# Patient Record
Sex: Male | Born: 1990 | Race: Asian | Hispanic: No | Marital: Single | Smoking: Never smoker
Health system: Southern US, Community
[De-identification: ages and names within clinical notes are randomized; demographics above are authoritative.]

## PROBLEM LIST (undated history)

## (undated) DIAGNOSIS — S060XAA Concussion with loss of consciousness status unknown, initial encounter: Secondary | ICD-10-CM

## (undated) DIAGNOSIS — R5600 Simple febrile convulsions: Secondary | ICD-10-CM

## (undated) DIAGNOSIS — Z789 Other specified health status: Secondary | ICD-10-CM

## (undated) DIAGNOSIS — S060X9A Concussion with loss of consciousness of unspecified duration, initial encounter: Secondary | ICD-10-CM

## (undated) HISTORY — PX: FINGER SURGERY: SHX640

## (undated) HISTORY — PX: INNER EAR SURGERY: SHX679

---
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
## 2011-09-21 ENCOUNTER — Emergency Department (HOSPITAL_COMMUNITY): Payer: Federal, State, Local not specified - PPO

## 2011-09-21 ENCOUNTER — Encounter (HOSPITAL_COMMUNITY): Payer: Self-pay

## 2011-09-21 ENCOUNTER — Inpatient Hospital Stay (HOSPITAL_COMMUNITY)
Admission: EM | Admit: 2011-09-21 | Discharge: 2011-09-26 | DRG: 734 | Disposition: A | Discharge status: Home / Self Care | Payer: Federal, State, Local not specified - PPO | Source: Ambulatory Visit | Attending: General Surgery | Admitting: General Surgery

## 2011-09-21 DIAGNOSIS — Y998 Other external cause status: Secondary | ICD-10-CM

## 2011-09-21 DIAGNOSIS — S36119A Unspecified injury of liver, initial encounter: Secondary | ICD-10-CM

## 2011-09-21 DIAGNOSIS — N2889 Other specified disorders of kidney and ureter: Secondary | ICD-10-CM | POA: Diagnosis present

## 2011-09-21 DIAGNOSIS — S36114A Minor laceration of liver, initial encounter: Secondary | ICD-10-CM | POA: Diagnosis present

## 2011-09-21 DIAGNOSIS — T1490XA Injury, unspecified, initial encounter: Secondary | ICD-10-CM | POA: Diagnosis present

## 2011-09-21 DIAGNOSIS — S36112A Contusion of liver, initial encounter: Secondary | ICD-10-CM

## 2011-09-21 DIAGNOSIS — S37009A Unspecified injury of unspecified kidney, initial encounter: Secondary | ICD-10-CM | POA: Diagnosis present

## 2011-09-21 DIAGNOSIS — S37039A Laceration of unspecified kidney, unspecified degree, initial encounter: Secondary | ICD-10-CM

## 2011-09-21 DIAGNOSIS — D62 Acute posthemorrhagic anemia: Secondary | ICD-10-CM

## 2011-09-21 DIAGNOSIS — Y9367 Activity, basketball: Secondary | ICD-10-CM

## 2011-09-21 DIAGNOSIS — Y9239 Other specified sports and athletic area as the place of occurrence of the external cause: Secondary | ICD-10-CM

## 2011-09-21 DIAGNOSIS — Y92838 Other recreation area as the place of occurrence of the external cause: Secondary | ICD-10-CM

## 2011-09-21 DIAGNOSIS — S36113A Laceration of liver, unspecified degree, initial encounter: Principal | ICD-10-CM | POA: Diagnosis present

## 2011-09-21 DIAGNOSIS — S37099A Other injury of unspecified kidney, initial encounter: Secondary | ICD-10-CM

## 2011-09-21 HISTORY — DX: Concussion with loss of consciousness of unspecified duration, initial encounter: S06.0X9A

## 2011-09-21 HISTORY — DX: Other specified health status: Z78.9

## 2011-09-21 HISTORY — DX: Simple febrile convulsions: R56.00

## 2011-09-21 HISTORY — DX: Concussion with loss of consciousness status unknown, initial encounter: S06.0XAA

## 2011-09-21 LAB — DIFFERENTIAL
Basophils Absolute: 0 K/uL (ref 0.0–0.1)
Basophils Relative: 0 % (ref 0–1)
Eosinophils Absolute: 0 K/uL (ref 0.0–0.7)
Eosinophils Relative: 0 % (ref 0–5)
Lymphocytes Relative: 11 % — ABNORMAL LOW (ref 12–46)
Lymphs Abs: 2.1 K/uL (ref 0.7–4.0)
Monocytes Absolute: 1.1 K/uL — ABNORMAL HIGH (ref 0.1–1.0)
Monocytes Relative: 6 % (ref 3–12)
Neutro Abs: 15.9 K/uL — ABNORMAL HIGH (ref 1.7–7.7)
Neutrophils Relative %: 83 % — ABNORMAL HIGH (ref 43–77)

## 2011-09-21 LAB — TYPE AND SCREEN
ABO/RH(D): A POS
Antibody Screen: NEGATIVE

## 2011-09-21 LAB — URINALYSIS, ROUTINE W REFLEX MICROSCOPIC
Glucose, UA: 100 mg/dL — AB
Ketones, ur: >80 mg/dL — AB
Nitrite: POSITIVE — AB
Protein, ur: >300 mg/dL — AB
Specific Gravity, Urine: 1.036 — ABNORMAL HIGH (ref 1.005–1.030)
Urobilinogen, UA: 1 mg/dL (ref 0.0–1.0)
pH: 5 (ref 5.0–8.0)

## 2011-09-21 LAB — PROTIME-INR
INR: 1.1 (ref 0.00–1.49)
Prothrombin Time: 14.4 s (ref 11.6–15.2)

## 2011-09-21 LAB — URINE MICROSCOPIC-ADD ON

## 2011-09-21 LAB — COMPREHENSIVE METABOLIC PANEL
ALT: 26 U/L (ref 0–53)
AST: 71 U/L — ABNORMAL HIGH (ref 0–37)
Albumin: 4.9 g/dL (ref 3.5–5.2)
Alkaline Phosphatase: 75 U/L (ref 39–117)
CO2: 23 mEq/L (ref 19–32)
Chloride: 99 mEq/L (ref 96–112)
Creatinine, Ser: 1.45 mg/dL — ABNORMAL HIGH (ref 0.50–1.35)
GFR calc non Af Amer: 67 mL/min — ABNORMAL LOW (ref >90)
Potassium: 3.7 mEq/L (ref 3.5–5.1)
Total Bilirubin: 0.8 mg/dL (ref 0.3–1.2)

## 2011-09-21 LAB — CBC
HCT: 46.7 % (ref 39.0–52.0)
MCV: 66.8 fL — ABNORMAL LOW (ref 78.0–100.0)
Platelets: 283 10*3/uL (ref 150–400)
RBC: 6.99 MIL/uL — ABNORMAL HIGH (ref 4.22–5.81)
RDW: 14.3 % (ref 11.5–15.5)
WBC: 19.1 10*3/uL — ABNORMAL HIGH (ref 4.0–10.5)

## 2011-09-21 MED ORDER — HYDROMORPHONE HCL PF 1 MG/ML IJ SOLN: 1.0000 mg | Freq: Once | INTRAMUSCULAR | Status: AC

## 2011-09-21 MED ORDER — CIPROFLOXACIN IN D5W 400 MG/200ML IV SOLN: 400.0000 mg | Freq: Two times a day (BID) | INTRAVENOUS | Status: DC

## 2011-09-21 MED ORDER — ONDANSETRON HCL 4 MG PO TABS: 4.0000 mg | ORAL_TABLET | Freq: Four times a day (QID) | ORAL | Status: DC | PRN

## 2011-09-21 MED ORDER — ONDANSETRON HCL 4 MG/2ML IJ SOLN
INTRAMUSCULAR | Status: AC
  Administered 2011-09-21: 4 mg
  Filled 2011-09-21: qty 2

## 2011-09-21 MED ORDER — SODIUM CHLORIDE 0.9 % IV SOLN
INTRAVENOUS | Status: DC
  Administered 2011-09-22 – 2011-09-23 (×4): via INTRAVENOUS
  Administered 2011-09-24: 75 mL via INTRAVENOUS
  Administered 2011-09-24: 100 mL via INTRAVENOUS
  Administered 2011-09-24: 23:00:00 via INTRAVENOUS

## 2011-09-21 MED ORDER — MORPHINE SULFATE 2 MG/ML IJ SOLN: 1.0000 mg | INTRAMUSCULAR | Status: DC | PRN

## 2011-09-21 MED ORDER — MORPHINE SULFATE 2 MG/ML IJ SOLN
2.0000 mg | INTRAMUSCULAR | Status: DC | PRN
  Administered 2011-09-22 – 2011-09-24 (×20): 2 mg via INTRAVENOUS
  Filled 2011-09-21 (×8): qty 1
  Filled 2011-09-21: qty 2
  Filled 2011-09-21 (×5): qty 1
  Filled 2011-09-21: qty 2
  Filled 2011-09-21 (×4): qty 1

## 2011-09-21 MED ORDER — IOHEXOL 300 MG/ML  SOLN
80.0000 mL | Freq: Once | INTRAMUSCULAR | Status: AC | PRN
  Administered 2011-09-21: 80 mL via INTRAVENOUS

## 2011-09-21 MED ORDER — SODIUM CHLORIDE 0.9 % IV BOLUS (SEPSIS)
1000.0000 mL | Freq: Once | INTRAVENOUS | Status: AC
  Administered 2011-09-21: 1000 mL via INTRAVENOUS

## 2011-09-21 MED ORDER — SODIUM CHLORIDE 0.9 % IV SOLN
INTRAVENOUS | Status: DC
  Administered 2011-09-21: 1000 mL via INTRAVENOUS

## 2011-09-21 MED ORDER — HYDROMORPHONE HCL PF 1 MG/ML IJ SOLN
INTRAMUSCULAR | Status: AC
  Administered 2011-09-21: 1 mg
  Filled 2011-09-21: qty 1

## 2011-09-21 MED ORDER — KCL IN DEXTROSE-NACL 20-5-0.45 MEQ/L-%-% IV SOLN
INTRAVENOUS | Status: DC
  Administered 2011-09-21: 1000 mL via INTRAVENOUS
  Filled 2011-09-21 (×2): qty 1000

## 2011-09-21 MED ORDER — PANTOPRAZOLE SODIUM 40 MG PO TBEC: 40.0000 mg | DELAYED_RELEASE_TABLET | Freq: Every day | ORAL | Status: DC

## 2011-09-21 MED ORDER — CIPROFLOXACIN IN D5W 400 MG/200ML IV SOLN
400.0000 mg | Freq: Two times a day (BID) | INTRAVENOUS | Status: DC
  Administered 2011-09-21 – 2011-09-26 (×10): 400 mg via INTRAVENOUS
  Filled 2011-09-21 (×12): qty 200

## 2011-09-21 MED ORDER — CIPROFLOXACIN IN D5W 400 MG/200ML IV SOLN: 400.0000 mg | INTRAVENOUS | Status: DC

## 2011-09-21 MED ORDER — PANTOPRAZOLE SODIUM 40 MG IV SOLR
40.0000 mg | Freq: Every day | INTRAVENOUS | Status: DC
  Administered 2011-09-22 – 2011-09-24 (×4): 40 mg via INTRAVENOUS
  Filled 2011-09-21 (×7): qty 40

## 2011-09-21 MED ORDER — ONDANSETRON HCL 4 MG/2ML IJ SOLN: 4.0000 mg | Freq: Once | INTRAMUSCULAR | Status: AC

## 2011-09-21 MED ORDER — SODIUM CHLORIDE 0.9 % IV SOLN: INTRAVENOUS | Status: DC

## 2011-09-21 MED ORDER — ONDANSETRON HCL 4 MG/2ML IJ SOLN: 4.0000 mg | Freq: Four times a day (QID) | INTRAMUSCULAR | Status: DC | PRN

## 2011-09-21 MED ORDER — HYDROMORPHONE HCL PF 1 MG/ML IJ SOLN
1.0000 mg | Freq: Once | INTRAMUSCULAR | Status: AC
  Administered 2011-09-21: 1 mg via INTRAVENOUS
  Filled 2011-09-21: qty 1

## 2011-09-21 MED ORDER — KCL IN DEXTROSE-NACL 20-5-0.45 MEQ/L-%-% IV SOLN: INTRAVENOUS | Status: DC

## 2011-09-21 MED ORDER — ONDANSETRON HCL 4 MG/2ML IJ SOLN
4.0000 mg | INTRAMUSCULAR | Status: DC | PRN
  Administered 2011-09-21 – 2011-09-23 (×10): 4 mg via INTRAVENOUS
  Filled 2011-09-21 (×10): qty 2

## 2011-09-21 NOTE — Progress Notes (Signed)
Patient ID: Richard Guerra, male   DOB: Mar 29, 1991, 21 y.o.   MRN: [id number] Chief Complaint:  Was need and right upper quadrant during basketball game at [employer]  History of Present Illness:  Richard Guerra is an 21 y.o. male who was struck locking and layup with a knee getting him and his right upper quadrant or right flank. He drove himself home where he had vomiting and bloody urine. He was brought to the [facility] emergency room where a CT scan demonstrated a grade 4 right kidney laceration and a posterior liver contusion. He's been hemodynamic stable and is transferred to [facility] for intensive [facility] monitoring. He is being followed by Dr. Roselee Nova in the urology team.  Past Medical History  Diagnosis Date  . Febrile seizure     at age 17 months  . Concussion     at age 9 due to fall while playing football    Past Surgical History  Procedure Date  . Inner ear surgery     tubes in both ears approx. 36 months old    Current Facility-Administered Medications  Medication Dose Route Frequency Provider Last Rate Last Dose  . 0.9 %  sodium chloride infusion   Intravenous Continuous Flint Melter, MD 125 mL/hr at 09/21/11 2010 1,000 mL at 09/21/11 2010  . ciprofloxacin (CIPRO) IVPB 400 mg  400 mg Intravenous Q24H Scott A MacDiarmid, MD      . dextrose 5 % and 0.45 % NaCl with KCl 20 mEq/L infusion   Intravenous Continuous Scott A MacDiarmid, MD      . dextrose 5 % and 0.45 % NaCl with KCl 20 mEq/L infusion   Intravenous Continuous Valarie Merino, MD      . HYDROmorphone (DILAUDID) 1 MG/ML injection        1 mg at 09/21/11 1858  . HYDROmorphone (DILAUDID) injection 1 mg  1 mg Intravenous Once Flint Melter, MD      . HYDROmorphone (DILAUDID) injection 1 mg  1 mg Intravenous Once Flint Melter, MD   1 mg at 09/21/11 2009  . iohexol (OMNIPAQUE) 300 MG/ML solution 80 mL  80 mL Intravenous Once PRN Medication Radiologist, MD   80 mL at 09/21/11 1929  . morphine 2 MG/ML injection  1 mg  1 mg Intravenous Q1H PRN Valarie Merino, MD      . morphine 2 MG/ML injection 1 mg  1 mg Intravenous Q2H PRN Valarie Merino, MD      . morphine 2 MG/ML injection 2-4 mg  2-4 mg Intravenous Q2H PRN Martina Sinner, MD      . ondansetron (ZOFRAN) 4 MG/2ML injection        4 mg at 09/21/11 1859  . ondansetron (ZOFRAN) injection 4 mg  4 mg Intravenous Once Flint Melter, MD      . ondansetron [facility]) injection 4 mg  4 mg Intravenous Q4H PRN Martina Sinner, MD      . ondansetron (ZOFRAN) tablet 4 mg  4 mg Oral Q6H PRN Valarie Merino, MD       Or  . ondansetron [facility]) injection 4 mg  4 mg Intravenous Q6H PRN Valarie Merino, MD      . pantoprazole (PROTONIX) EC tablet 40 mg  40 mg Oral Q1200 Valarie Merino, MD       Or  . pantoprazole (PROTONIX) injection 40 mg  40 mg Intravenous Q1200 Valarie Merino, MD      .  sodium chloride 0.9 % bolus 1,000 mL  1,000 mL Intravenous Once Flint Melter, MD   1,000 mL at 09/21/11 1859  . sodium chloride 0.9 % bolus 1,000 mL  1,000 mL Intravenous Once Flint Melter, MD   1,000 mL at 09/21/11 1950   No current outpatient prescriptions on file.   Review of patient's allergies indicates no known allergies. No family history on file. Social History:   does not have a smoking history on file. He has never used smokeless tobacco. He reports that he drinks alcohol. He reports that he uses illicit drugs (Marijuana).   REVIEW OF SYSTEMS - PERTINENT POSITIVES ONLY: Noncontributory  Physical Exam:   Blood pressure 135/47, pulse 72, temperature 97.8 F (36.6 C), temperature source Oral, resp. rate 14, height 5\' 5"  (1.651 m), weight 155 lb (70.308 kg), SpO2 100.00%. Body mass index is 25.79 kg/(m^2).  Gen:  WDWN male NAD  Neurological: Alert and oriented to person, place, and time. Motor and sensory function is grossly intact  Head: Normocephalic and atraumatic.  Eyes: Conjunctivae are normal. Pupils are equal, round, and reactive to  light. No scleral icterus.  Neck: Normal range of motion. Neck supple. No tracheal deviation or thyromegaly present.  Cardiovascular:  SR without murmurs or gallops.  No carotid bruits Respiratory: Effort normal.  No respiratory distress. No chest wall tenderness. Breath sounds normal.  No wheezes, rales or rhonchi.  Abdomen:  No external abrasions or ecchymoses but tender in right flank and back and right upper quadrant. GU:  Gross blood in his urine Musculoskeletal: Normal range of motion. Extremities are nontender. No cyanosis, edema or clubbing noted Lymphadenopathy: No cervical, preauricular, postauricular or axillary adenopathy is present Skin: Skin is warm and dry. No rash noted. No diaphoresis. No erythema. No pallor. Pscyh: Normal mood and affect. Behavior is normal. Judgment and thought content normal.   LABORATORY RESULTS: Results for orders placed during the hospital encounter of 09/21/11 (from the past 48 hour(s))  PROTIME-INR     Status: Normal   Collection Time   09/21/11  4:50 PM      Component Value Range Comment   Prothrombin Time 14.4  11.6 - 15.2 (seconds)    INR 1.10  0.00 - 1.49    CBC     Status: Abnormal   Collection Time   09/21/11  6:50 PM      Component Value Range Comment   WBC 19.1 (*) 4.0 - 10.5 (K/uL)    RBC 6.99 (*) 4.22 - 5.81 (MIL/uL)    Hemoglobin 15.3  13.0 - 17.0 (g/dL)    HCT 16.1  09.6 - 04.5 (%)    MCV 66.8 (*) 78.0 - 100.0 (fL)    MCH 21.9 (*) 26.0 - 34.0 (pg)    MCHC 32.8  30.0 - 36.0 (g/dL)    RDW 40.9  81.1 - 91.4 (%)    Platelets 283  150 - 400 (K/uL)   DIFFERENTIAL     Status: Abnormal   Collection Time   09/21/11  6:50 PM      Component Value Range Comment   Neutrophils Relative 83 (*) 43 - 77 (%)    Lymphocytes Relative 11 (*) 12 - 46 (%)    Monocytes Relative 6  3 - 12 (%)    Eosinophils Relative 0  0 - 5 (%)    Basophils Relative 0  0 - 1 (%)    Neutro Abs 15.9 (*) 1.7 - 7.7 (K/uL)  Lymphs Abs 2.1  0.7 - 4.0 (K/uL)    Monocytes  Absolute 1.1 (*) 0.1 - 1.0 (K/uL)    Eosinophils Absolute 0.0  0.0 - 0.7 (K/uL)    Basophils Absolute 0.0  0.0 - 0.1 (K/uL)    Smear Review MORPHOLOGY UNREMARKABLE     COMPREHENSIVE METABOLIC PANEL     Status: Abnormal   Collection Time   09/21/11  6:50 PM      Component Value Range Comment   Sodium 138  135 - 145 (mEq/L)    Potassium 3.7  3.5 - 5.1 (mEq/L)    Chloride 99  96 - 112 (mEq/L)    CO2 23  19 - 32 (mEq/L)    Glucose, Bld 150 (*) 70 - 99 (mg/dL)    BUN 26 (*) 6 - 23 (mg/dL)    Creatinine, Ser 1.61 (*) 0.50 - 1.35 (mg/dL)    Calcium 9.9  8.4 - 10.5 (mg/dL)    Total Protein 7.5  6.0 - 8.3 (g/dL)    Albumin 4.9  3.5 - 5.2 (g/dL)    AST 71 (*) 0 - 37 (U/L)    ALT 26  0 - 53 (U/L)    Alkaline Phosphatase 75  39 - 117 (U/L)    Total Bilirubin 0.8  0.3 - 1.2 (mg/dL)    GFR calc non Af Amer 67 (*) >90 (mL/min)    GFR calc Af Amer 78 (*) >90 (mL/min)   GLUCOSE, CAPILLARY     Status: Abnormal   Collection Time   09/21/11  6:57 PM      Component Value Range Comment   Glucose-Capillary 129 (*) 70 - 99 (mg/dL)   TYPE AND SCREEN     Status: Normal   Collection Time   09/21/11  7:00 PM      Component Value Range Comment   ABO/RH(D) A POS      Antibody Screen NEG      Sample Expiration 09/24/2011     URINALYSIS, ROUTINE W REFLEX MICROSCOPIC     Status: Abnormal   Collection Time   09/21/11  7:14 PM      Component Value Range Comment   Color, Urine RED (*) YELLOW  BIOCHEMICALS MAY BE AFFECTED BY COLOR   APPearance TURBID (*) CLEAR     Specific Gravity, Urine 1.036 (*) 1.005 - 1.030     pH 5.0  5.0 - 8.0     Glucose, UA 100 (*) NEGATIVE (mg/dL)    Hgb urine dipstick LARGE (*) NEGATIVE     Bilirubin Urine LARGE (*) NEGATIVE     Ketones, ur >80 (*) NEGATIVE (mg/dL)    Protein, ur >096 (*) NEGATIVE (mg/dL)    Urobilinogen, UA 1.0  0.0 - 1.0 (mg/dL)    Nitrite POSITIVE (*) NEGATIVE     Leukocytes, UA LARGE (*) NEGATIVE    URINE MICROSCOPIC-ADD ON     Status: Abnormal   Collection  Time   09/21/11  7:14 PM      Component Value Range Comment   WBC, UA FIELD OBSCURED BY RBC'S  <3 (WBC/hpf)    RBC / HPF TOO NUMEROUS TO COUNT  <3 (RBC/hpf)    Bacteria, UA MANY (*) RARE      RADIOLOGY RESULTS: Dg Chest 2 View  09/21/2011  *RADIOLOGY REPORT*  Clinical Data: Trauma to abdomen.  Severe abdominal pain. Fractured kidney.  CHEST - 2 VIEW  Comparison: None.  Findings: The heart size is normal.  The lungs are clear.  The visualized soft tissues and bony thorax are unremarkable.  IMPRESSION: Negative chest.  Original Report Authenticated By: Jamesetta Orleans. MATTERN, M.D.   Ct Abdomen Pelvis W Contrast  09/21/2011  *RADIOLOGY REPORT*  Clinical Data: History of blunt trauma to the abdomen complaining of abdominal pain.  CT ABDOMEN AND PELVIS WITH CONTRAST  Technique:  Multidetector CT imaging of the abdomen and pelvis was performed following the standard protocol during bolus administration of intravenous contrast.  Contrast: 80mL OMNIPAQUE IOHEXOL 300 MG/ML  SOLN  Comparison: No priors.  Findings:  Lung Bases: Unremarkable.  Abdomen/Pelvis:  Portions of the right kidney fail to enhance, and there is a large amount of high attenuation material surrounding the right kidney filling the perinephric space and portions of the right anterior pararenal space, compatible with a large perinephric hemorrhage.  The hemorrhage extends inferiorly into the right hemi pelvis via the retroperitoneum. Within the perinephric hemorrhage there are some foci of higher attenuation (approximately 73 HU), that are suspicious for but not definitive for persistent active extravasation.  The right renal artery and right renal vein appears severely attenuated in the right renal hilum.  There does appear to be a single right renal artery, giving off a small branch to the lower pole (best demonstrated on images 33 - 39 of series 6).  In addition, there is subtle decreased enhancement in the posterior aspect of the right lobe of  the liver (segments of six and seven), suggestive of a contusion.  The appearance of the gallbladder, pancreas, spleen, left adrenal gland and left kidney is unremarkable.  The right adrenal gland is difficult to visualize, but appears to be normal.  There is no pneumoperitoneum.  No pathologic distension of bowel. No definite pathologic lymphadenopathy identified within the abdomen or pelvis.  The patient does have a right inguinal hernia which appears to contain only fat.  Prostate urinary bladder are unremarkable.  Musculoskeletal: No acute displaced fractures are noted in the visualized portions of the skeleton. There are no aggressive appearing lytic or blastic lesions noted in the visualized portions of the skeleton.  IMPRESSION: 1.  Findings, as above, consistent with severe right renal laceration (a fractured kidney) with a very large right-sided perinephric hemorrhage with extension of hemorrhage through the anterior pararenal space of the retroperitoneum on the right side into the right hemi pelvis.  2.  In addition, there appears to be a contusion of the posterior aspect of the right hepatic lobe. 3.  Small right inguinal hernia containing only fat incidentally noted.  Critical Value/emergent results were called by telephone at the time of interpretation on 09/21/2011  at 07:50 p.m.  to  Dr. Effie Shy, who verbally acknowledged these results.  Original Report Authenticated By: Florencia Reasons, M.D.    Problem List: Patient Active Problem List  Diagnoses  . Renal hemorrhage, right-traumatic    Assessment & Plan: Right kidney hemorrhage from the grade 4 kidney laceration. Plan admit for observation with possible embolization to manage further bleeding. For now the bleeding seems confined. Plan repeat CT scan and serial CBCs.    Matt B. Daphine Deutscher, MD, [facility] [phone] beeper [phone]  09/21/2011 9:06 PM

## 2011-09-21 NOTE — Consult Note (Signed)
Urology Consult  Referring physician: [facility] Reason for referral: Renal trauma  Chief Complaint: Renal trauma/laceration  History of Present Illness: Knee to abdomen playing basketball. Pain in RUQ. Was associated with nausea. No prior GU history or stones. NO uti's. Voiding today since injury but gross blood in urine. Hb 15.9 Cr elevated General surgery to assess for other injuries including liver hematomas?  Modifying factors: There are no other modifying factors  Associated signs and symptoms: There are no other associated signs and symptoms Aggravating and relieving factors: There are no other aggravating or relieving factors; pain meds given Severity: Severe Duration: Persistent   History reviewed. No pertinent past medical history. History reviewed. No pertinent past surgical history.  Medications: I have reviewed the patient's current medications. Allergies: No Known Allergies  No family history on file. Social History:  does not have a smoking history on file. He does not have any smokeless tobacco history on file. He reports that he does not drink alcohol or use illicit drugs.  ROS: All systems are reviewed and negative except as noted.   Physical Exam:  Vital signs in last 24 hours: Temp:  [97.4 F (36.3 C)] 97.4 F (36.3 C) (06/05 1902) Pulse Rate:  [62-85] 71  (06/05 1936) Resp:  [12-24] 12  (06/05 1936) BP: (124-133)/(66-84) 133/84 mmHg (06/05 1936) SpO2:  [100 %] 100 % (06/05 1936)  Cardiovascular: Skin warm; not flushed Respiratory: Breaths quiet; no shortness of breath Abdomen: No masses Neurological: Normal sensation to touch Musculoskeletal: Normal motor function arms and legs Lymphatics: No inguinal adenopathy Skin: No rashes Genitourinary:No palpable mass; tender RUQ; bit obtunded but responding appropriately  Laboratory Data:  Results for orders placed during the hospital encounter of 09/21/11 (from the past 72 hour(s))  COMPREHENSIVE METABOLIC  PANEL     Status: Abnormal   Collection Time   09/21/11  6:50 PM      Component Value Range Comment   Sodium 138  135 - 145 (mEq/L)    Potassium 3.7  3.5 - 5.1 (mEq/L)    Chloride 99  96 - 112 (mEq/L)    CO2 23  19 - 32 (mEq/L)    Glucose, Bld 150 (*) 70 - 99 (mg/dL)    BUN 26 (*) 6 - 23 (mg/dL)    Creatinine, Ser 1.61 (*) 0.50 - 1.35 (mg/dL)    Calcium 9.9  8.4 - 10.5 (mg/dL)    Total Protein 7.5  6.0 - 8.3 (g/dL)    Albumin 4.9  3.5 - 5.2 (g/dL)    AST 71 (*) 0 - 37 (U/L)    ALT 26  0 - 53 (U/L)    Alkaline Phosphatase 75  39 - 117 (U/L)    Total Bilirubin 0.8  0.3 - 1.2 (mg/dL)    GFR calc non Af Amer 67 (*) >90 (mL/min)    GFR calc Af Amer 78 (*) >90 (mL/min)   GLUCOSE, CAPILLARY     Status: Abnormal   Collection Time   09/21/11  6:57 PM      Component Value Range Comment   Glucose-Capillary 129 (*) 70 - 99 (mg/dL)    No results found for this or any previous visit (from the past 240 hour(s)). Creatinine:  Basename 09/21/11 1850  CREATININE 1.45*    Xrays: See report/chart Reviewed films with radiology and Dr Vernie Ammons: severe fracture of right kidney with some perfusion of the upper pole and inferior pole; not a lot of extravasation; perinephric hematoma large  Impression/Assessment:  Severe laceration to right kidney from blunt trauma; some perfusion of kidney; hemodynamically stable; Hb normal; elevated Cr  Plan:  Discussed with family and General Surgery See orders Serial Hb/NP/CT in 48 hours/consider embolize if unstable  Jasaun Carn A 09/21/2011, 7:45 PM

## 2011-09-21 NOTE — ED Notes (Signed)
MD at bedside. 

## 2011-09-21 NOTE — Progress Notes (Signed)
PHARMACY BRIEF NOTE -- ANTIBIOTIC RENAL DOSE ADJUSTMENT  Asked to review the dose of IV Ciprofloxacin ordered for a 21 year-old male admitted with right kidney laceration with large hematoma and small lliver contusion secondary to blunt trauma in a basketball game.  Patient Measurements: Height: 5\' 5"  (165.1 cm) Weight: 155 lb (70.308 kg) IBW/kg (Calculated) : 61.5   Labs: 09/21/11   BUN 26    SCr 1.45    Estimated CrCl 69.5 ml/min  Assessment: The ordered dose of IV Ciprofloxacin is below the recommended dose, which is 400 mg every 12 hours while CrCl above 30 ml/min.    Goal of Therapy:  Prevention of infection  Plan:   Increase the dose of IV Ciprofloxacin to 400 mg every 12 hours.  Monitor the renal function closely; modify the dose if needed.  Polo Riley R.Ph. 09/21/2011 9:25 PM    ,

## 2011-09-21 NOTE — ED Notes (Signed)
Day shift tech reported to nurse Kennyth Arnold that patient vomited and that patient had blood in his urine.

## 2011-09-21 NOTE — ED Notes (Signed)
Pt states kneed in abdomen while playing basketball 1hr ago, vomiting x3; pt pale, bent over in pain. On assessment pt hyperventilating, went unresponsive EDP at bedside, O2 NRM applied and pt responded c/o tingling all over. Pain meds given as ordered.

## 2011-09-21 NOTE — ED Notes (Signed)
Family (mother) at bedside.

## 2011-09-21 NOTE — ED Provider Notes (Cosign Needed)
History     CSN: [id number]  Arrival date & time 09/21/11  1830   First MD Initiated Contact with Patient 09/21/11 1843      Chief Complaint  Patient presents with  . Abdominal Injury    (Consider location/radiation/quality/duration/timing/severity/associated sxs/prior treatment) HPI Comments: Richard Guerra is a 21 y.o. Male who was injured. In a basketball game. He feels like he sustained a knee injury to the right upper abdomen. He had to stop playing due to the pain. He is able to drive home. His mother then brought him here because he began to feel weak, and had increased pain. On arrival to the emergency department. Patient became unresponsive for short period of time. I immediately arrived at the bedside. He was alert. He was in severe pain. Appears the pain and caused him to become temporarily unresponsive. Evaluation proceeded. Patient did not give additional history. He did not have additional complaints. The pain is worse with deep breathing, and palpation to the right upper abdomen.  The history is provided by the patient.    Past Medical History  Diagnosis Date  . Febrile seizure     at age 77 months  . Concussion     at age 18 due to fall while playing football    Past Surgical History  Procedure Date  . Inner ear surgery     tubes in both ears approx. 76 months old    No family history on file.  History  Substance Use Topics  . Smoking status: Not on file  . Smokeless tobacco: Never Used  . Alcohol Use: Yes     social      Review of Systems  All other systems reviewed and are negative.    Allergies  Review of patient's allergies indicates no known allergies.  Home Medications  No current outpatient prescriptions on file.  BP 117/62  Pulse 60  Temp(Src) 98.7 F (37.1 C) (Oral)  Resp 16  Ht 5\' 5"  (1.651 m)  Wt 155 lb (70.308 kg)  BMI 25.79 kg/m2  SpO2 99%  Physical Exam  Nursing note and vitals reviewed. Constitutional: He is oriented  to person, place, and time. He appears well-developed and well-nourished.  HENT:  Head: Normocephalic and atraumatic.  Right Ear: External ear normal.  Left Ear: External ear normal.  Eyes: Conjunctivae and EOM are normal. Pupils are equal, round, and reactive to light.  Neck: Normal range of motion and phonation normal. Neck supple.  Cardiovascular: Normal rate, regular rhythm, normal heart sounds and intact distal pulses.   Pulmonary/Chest: Effort normal and breath sounds normal. He exhibits tenderness (moderate right lower chest wall, tenderness, no deformity.). He exhibits no bony tenderness.  Abdominal: Soft. Normal appearance. He exhibits no mass. There is tenderness (severe right upper quadrant, pain, and guarding.). There is guarding. There is no rebound.  Musculoskeletal: Normal range of motion.  Neurological: He is alert and oriented to person, place, and time. He has normal strength. No cranial nerve deficit or sensory deficit. He exhibits normal muscle tone. Coordination normal.  Skin: Skin is warm, dry and intact.  Psychiatric: He has a normal mood and affect. His behavior is normal. Judgment and thought content normal.    ED Course  Procedures (including critical care time)  Foley placed and frank blood returned.  Vital signs, repeated and were stable (19:48) CT scan reveals right kidney laceration with large hematoma associated. Small liver contusion noted. Case discussed with urology and general surgery/trauma; both of whom  will evaluate the patient. In emergency department (19:49)  Reevaluation 19:45- he remains alert. Initial pain medicine helped, but he now has more pain. Second dose of Dilaudid is ordered. Blood sent for type and crossmatch.  CRITICAL CARE Performed by: Mancel Bale L   Total critical care time: 50 min.  Critical care time was exclusive of separately billable procedures and treating other patients.  Critical care was necessary to treat or  prevent imminent or life-threatening deterioration.  Critical care was time spent personally by me on the following activities: development of treatment plan with patient and/or surrogate as well as nursing, discussions with consultants, evaluation of patient's response to treatment, examination of patient, obtaining history from patient or surrogate, ordering and performing treatments and interventions, ordering and review of laboratory studies, ordering and review of radiographic studies, pulse oximetry and re-evaluation of patient's condition.    Labs Reviewed  CBC - Abnormal; Notable for the following:    WBC 19.1 (*)    RBC 6.99 (*)    MCV 66.8 (*)    MCH 21.9 (*)    All other components within normal limits  DIFFERENTIAL - Abnormal; Notable for the following:    Neutrophils Relative 83 (*)    Lymphocytes Relative 11 (*)    Neutro Abs 15.9 (*)    Monocytes Absolute 1.1 (*)    All other components within normal limits  COMPREHENSIVE METABOLIC PANEL - Abnormal; Notable for the following:    Glucose, Bld 150 (*)    BUN 26 (*)    Creatinine, Ser 1.45 (*)    AST 71 (*)    GFR calc non Af Amer 67 (*)    GFR calc Af Amer 78 (*)    All other components within normal limits  URINALYSIS, ROUTINE W REFLEX MICROSCOPIC - Abnormal; Notable for the following:    Color, Urine RED (*) BIOCHEMICALS MAY BE AFFECTED BY COLOR   APPearance TURBID (*)    Specific Gravity, Urine 1.036 (*)    Glucose, UA 100 (*)    Hgb urine dipstick LARGE (*)    Bilirubin Urine LARGE (*)    Ketones, ur >80 (*)    Protein, ur >300 (*)    Nitrite POSITIVE (*)    Leukocytes, UA LARGE (*)    All other components within normal limits  GLUCOSE, CAPILLARY - Abnormal; Notable for the following:    Glucose-Capillary 129 (*)    All other components within normal limits  URINE MICROSCOPIC-ADD ON - Abnormal; Notable for the following:    Bacteria, UA MANY (*)    All other components within normal limits  CBC -  Abnormal; Notable for the following:    WBC 25.2 (*)    Hemoglobin 12.3 (*)    HCT 37.0 (*)    MCV 67.2 (*)    MCH 22.3 (*)    All other components within normal limits  TYPE AND SCREEN  ABO/RH  PROTIME-INR  URINE CULTURE  BASIC METABOLIC PANEL  CBC  MRSA PCR SCREENING  CBC  CBC  BASIC METABOLIC PANEL  CBC  CBC   Dg Chest 2 View  09/21/2011  *RADIOLOGY REPORT*  Clinical Data: Trauma to abdomen.  Severe abdominal pain. Fractured kidney.  CHEST - 2 VIEW  Comparison: None.  Findings: The heart size is normal.  The lungs are clear.  The visualized soft tissues and bony thorax are unremarkable.  IMPRESSION: Negative chest.  Original Report Authenticated By: Jamesetta Orleans. MATTERN, M.D.   Ct Abdomen Pelvis  W Contrast  09/21/2011  *RADIOLOGY REPORT*  Clinical Data: History of blunt trauma to the abdomen complaining of abdominal pain.  CT ABDOMEN AND PELVIS WITH CONTRAST  Technique:  Multidetector CT imaging of the abdomen and pelvis was performed following the standard protocol during bolus administration of intravenous contrast.  Contrast: 80mL OMNIPAQUE IOHEXOL 300 MG/ML  SOLN  Comparison: No priors.  Findings:  Lung Bases: Unremarkable.  Abdomen/Pelvis:  Portions of the right kidney fail to enhance, and there is a large amount of high attenuation material surrounding the right kidney filling the perinephric space and portions of the right anterior pararenal space, compatible with a large perinephric hemorrhage.  The hemorrhage extends inferiorly into the right hemi pelvis via the retroperitoneum. Within the perinephric hemorrhage there are some foci of higher attenuation (approximately 73 HU), that are suspicious for but not definitive for persistent active extravasation.  The right renal artery and right renal vein appears severely attenuated in the right renal hilum.  There does appear to be a single right renal artery, giving off a small branch to the lower pole (best demonstrated on images 33 -  39 of series 6).  In addition, there is subtle decreased enhancement in the posterior aspect of the right lobe of the liver (segments of six and seven), suggestive of a contusion.  The appearance of the gallbladder, pancreas, spleen, left adrenal gland and left kidney is unremarkable.  The right adrenal gland is difficult to visualize, but appears to be normal.  There is no pneumoperitoneum.  No pathologic distension of bowel. No definite pathologic lymphadenopathy identified within the abdomen or pelvis.  The patient does have a right inguinal hernia which appears to contain only fat.  Prostate urinary bladder are unremarkable.  Musculoskeletal: No acute displaced fractures are noted in the visualized portions of the skeleton. There are no aggressive appearing lytic or blastic lesions noted in the visualized portions of the skeleton.  IMPRESSION: 1.  Findings, as above, consistent with severe right renal laceration (a fractured kidney) with a very large right-sided perinephric hemorrhage with extension of hemorrhage through the anterior pararenal space of the retroperitoneum on the right side into the right hemi pelvis.  2.  In addition, there appears to be a contusion of the posterior aspect of the right hepatic lobe. 3.  Small right inguinal hernia containing only fat incidentally noted.  Critical Value/emergent results were called by telephone at the time of interpretation on 09/21/2011  at 07:50 p.m.  to  Dr. Effie Shy, who verbally acknowledged these results.  Original Report Authenticated By: Florencia Reasons, M.D.     1. Fractured kidney   2. Liver contusion       MDM  Kidney, fracture, and liver contusion, secondary to blunt trauma in a basketball game. Patient is critically ill and will need close monitoring and possibly a surgical procedure.          Flint Melter, MD 09/22/11 1610  Flint Melter, MD 10/18/11 2049

## 2011-09-22 ENCOUNTER — Encounter (HOSPITAL_COMMUNITY): Payer: Self-pay | Admitting: Infectious Diseases

## 2011-09-22 LAB — BASIC METABOLIC PANEL
CO2: 24 mEq/L (ref 19–32)
CO2: 27 mEq/L (ref 19–32)
Chloride: 103 mEq/L (ref 96–112)
GFR calc Af Amer: 78 mL/min — ABNORMAL LOW (ref >90)
Glucose, Bld: 104 mg/dL — ABNORMAL HIGH (ref 70–99)
Potassium: 4.5 mEq/L (ref 3.5–5.1)
Potassium: 4.5 mEq/L (ref 3.5–5.1)
Sodium: 137 mEq/L (ref 135–145)
Sodium: 137 mEq/L (ref 135–145)

## 2011-09-22 LAB — CBC
HCT: 37 % — ABNORMAL LOW (ref 39.0–52.0)
HCT: 32.7 % — ABNORMAL LOW (ref 39.0–52.0)
Hemoglobin: 12.3 g/dL — ABNORMAL LOW (ref 13.0–17.0)
MCH: 22.7 pg — ABNORMAL LOW (ref 26.0–34.0)
MCV: 67.2 fL — ABNORMAL LOW (ref 78.0–100.0)
MCV: 67.4 fL — ABNORMAL LOW (ref 78.0–100.0)
Platelets: 186 10*3/uL (ref 150–400)
Platelets: 157 10*3/uL (ref 150–400)
RBC: 5.51 MIL/uL (ref 4.22–5.81)
RBC: 5.34 MIL/uL (ref 4.22–5.81)
RBC: 4.85 MIL/uL (ref 4.22–5.81)
RDW: 14.5 % (ref 11.5–15.5)
WBC: 25.2 10*3/uL — ABNORMAL HIGH (ref 4.0–10.5)
WBC: 19.2 10*3/uL — ABNORMAL HIGH (ref 4.0–10.5)
WBC: 20 10*3/uL — ABNORMAL HIGH (ref 4.0–10.5)

## 2011-09-22 LAB — URINE CULTURE
Colony Count: NO GROWTH
Culture: NO GROWTH

## 2011-09-22 LAB — MRSA PCR SCREENING: MRSA by PCR: NEGATIVE

## 2011-09-22 MED ORDER — PROMETHAZINE HCL 25 MG/ML IJ SOLN
12.5000 mg | Freq: Four times a day (QID) | INTRAMUSCULAR | Status: DC | PRN
  Administered 2011-09-22 – 2011-09-24 (×5): 12.5 mg via INTRAVENOUS
  Filled 2011-09-22 (×5): qty 1

## 2011-09-22 NOTE — Progress Notes (Addendum)
Patient ID: Richard Guerra, male   DOB: 22-Jan-1991, 21 y.o.   MRN: [id number]    Subjective: Less nausea  Objective: Vital signs in last 24 hours: Temp:  [97.4 F (36.3 C)-99 F (37.2 C)] 98.1 F (36.7 C) (06/06 0806) Pulse Rate:  [57-92] 71  (06/06 0700) Resp:  [12-24] 19  (06/06 0700) BP: (111-143)/(47-84) 113/60 mmHg (06/06 0700) SpO2:  [97 %-100 %] 99 % (06/06 0700) Weight:  [70.308 kg (155 lb)] 70.308 kg (155 lb) (06/05 2235)    Intake/Output from previous day: 06/05 0701 - 06/06 0700 In: 1417.1 [I.V.:1207.1; IV Piggyback:210] Out: 1000 [Urine:725; Emesis/NG output:275] Intake/Output this shift:    General appearance: cooperative and no distress Resp: clear to auscultation bilaterally Cardio: regular rate and rhythm GI: soft, tender R side/RUQ without guarding ND, few BS Neuro: MAE. F/C  Lab Results: CBC   Basename 09/22/11 0515 09/21/11 2323  WBC 19.2* 25.2*  HGB 12.1* 12.3*  HCT 35.9* 37.0*  PLT 186 179   BMET  Basename 09/22/11 0700 09/21/11 1850  NA 137 138  K 4.5 3.7  CL 103 99  CO2 24 23  GLUCOSE 129* 150*  BUN 20 26*  CREATININE 1.45* 1.45*  CALCIUM 8.7 9.9   PT/INR  Basename 09/21/11 1650  LABPROT 14.4  INR 1.10   ABG No results found for this basename: PHART:2,PCO2:2,PO2:2,HCO3:2 in the last 72 hours  Studies/Results: Dg Chest 2 View  09/21/2011  *RADIOLOGY REPORT*  Clinical Data: Trauma to abdomen.  Severe abdominal pain. Fractured kidney.  CHEST - 2 VIEW  Comparison: None.  Findings: The heart size is normal.  The lungs are clear.  The visualized soft tissues and bony thorax are unremarkable.  IMPRESSION: Negative chest.  Original Report Authenticated By: Jamesetta Orleans. MATTERN, M.D.   Ct Abdomen Pelvis W Contrast  09/21/2011  *RADIOLOGY REPORT*  Clinical Data: History of blunt trauma to the abdomen complaining of abdominal pain.  CT ABDOMEN AND PELVIS WITH CONTRAST  Technique:  Multidetector CT imaging of the abdomen and pelvis was  performed following the standard protocol during bolus administration of intravenous contrast.  Contrast: 80mL OMNIPAQUE IOHEXOL 300 MG/ML  SOLN  Comparison: No priors.  Findings:  Lung Bases: Unremarkable.  Abdomen/Pelvis:  Portions of the right kidney fail to enhance, and there is a large amount of high attenuation material surrounding the right kidney filling the perinephric space and portions of the right anterior pararenal space, compatible with a large perinephric hemorrhage.  The hemorrhage extends inferiorly into the right hemi pelvis via the retroperitoneum. Within the perinephric hemorrhage there are some foci of higher attenuation (approximately 73 HU), that are suspicious for but not definitive for persistent active extravasation.  The right renal artery and right renal vein appears severely attenuated in the right renal hilum.  There does appear to be a single right renal artery, giving off a small branch to the lower pole (best demonstrated on images 33 - 39 of series 6).  In addition, there is subtle decreased enhancement in the posterior aspect of the right lobe of the liver (segments of six and seven), suggestive of a contusion.  The appearance of the gallbladder, pancreas, spleen, left adrenal gland and left kidney is unremarkable.  The right adrenal gland is difficult to visualize, but appears to be normal.  There is no pneumoperitoneum.  No pathologic distension of bowel. No definite pathologic lymphadenopathy identified within the abdomen or pelvis.  The patient does have a right inguinal hernia which appears to  contain only fat.  Prostate urinary bladder are unremarkable.  Musculoskeletal: No acute displaced fractures are noted in the visualized portions of the skeleton. There are no aggressive appearing lytic or blastic lesions noted in the visualized portions of the skeleton.  IMPRESSION: 1.  Findings, as above, consistent with severe right renal laceration (a fractured kidney) with a very  large right-sided perinephric hemorrhage with extension of hemorrhage through the anterior pararenal space of the retroperitoneum on the right side into the right hemi pelvis.  2.  In addition, there appears to be a contusion of the posterior aspect of the right hepatic lobe. 3.  Small right inguinal hernia containing only fat incidentally noted.  Critical Value/emergent results were called by telephone at the time of interpretation on 09/21/2011  at 07:50 p.m.  to  Dr. Effie Shy, who verbally acknowledged these results.  Original Report Authenticated By: Florencia Reasons, M.D.    Anti-infectives: Anti-infectives     Start     Dose/Rate Route Frequency Ordered Stop   09/22/11 1000   ciprofloxacin (CIPRO) IVPB 400 mg  Status:  Discontinued        400 mg 200 mL/hr over 60 Minutes Intravenous Every 12 hours 09/21/11 2108 09/21/11 2109   09/22/11 1000   ciprofloxacin (CIPRO) IVPB 400 mg        400 mg 200 mL/hr over 60 Minutes Intravenous Every 12 hours 09/21/11 2110     09/21/11 2100   ciprofloxacin (CIPRO) IVPB 400 mg  Status:  Discontinued        400 mg 200 mL/hr over 60 Minutes Intravenous Every 24 hours 09/21/11 2036 09/21/11 2108          Assessment/Plan: Blunt abdominal trauma R renal laceration - appreciate urology input, H/H changed to Q12 per Dr. McDiarmid, continue bedrest Liver contusion grade 2 Ileus - try sips today VTE - PAS Continue ICU today I spoke to the patient's mother by phone and answered her questions  LOS: 1 day    Violeta Gelinas, MD, MPH, FACS Pager: [phone]  09/22/2011

## 2011-09-22 NOTE — Progress Notes (Signed)
Dr. Dwain Sarna called d/t Richard Guerra's persistent nausea and vomiting despite ordered Zofran. He has vomited small amounts 50-100 cc bilious emesis three times since he was admitted at 2235. Orders received for phenergan IVP and will update MD with any further vomiting and possible need for NGT. Will continue to monitor.

## 2011-09-22 NOTE — Progress Notes (Signed)
Patient ID: Richard Guerra, male   DOB: 04-May-1990, 21 y.o.   MRN: [id number] I spoke to the patient's mother, grandmother, and sister at the bedside.  I reviewed his clinical situation and plan or care.  I answered their questions. Patient still vomiting after sips so continue ice chips only. Violeta Gelinas, MD, MPH, FACS Pager: [phone]

## 2011-09-22 NOTE — Progress Notes (Signed)
UR complete 

## 2011-09-23 LAB — CBC
HCT: 30.5 % — ABNORMAL LOW (ref 39.0–52.0)
HCT: 28.1 % — ABNORMAL LOW (ref 39.0–52.0)
Hemoglobin: 10.1 g/dL — ABNORMAL LOW (ref 13.0–17.0)
Hemoglobin: 9.2 g/dL — ABNORMAL LOW (ref 13.0–17.0)
MCH: 22.6 pg — ABNORMAL LOW (ref 26.0–34.0)
MCH: 22 pg — ABNORMAL LOW (ref 26.0–34.0)
MCHC: 33.1 g/dL (ref 30.0–36.0)
MCHC: 32.7 g/dL (ref 30.0–36.0)
MCV: 68.4 fL — ABNORMAL LOW (ref 78.0–100.0)
Platelets: 157 10*3/uL (ref 150–400)
RBC: 4.46 MIL/uL (ref 4.22–5.81)
RDW: 14.8 % (ref 11.5–15.5)
RDW: 14.5 % (ref 11.5–15.5)
WBC: 17.3 10*3/uL — ABNORMAL HIGH (ref 4.0–10.5)

## 2011-09-23 LAB — BASIC METABOLIC PANEL
BUN: 16 mg/dL (ref 6–23)
BUN: 13 mg/dL (ref 6–23)
CO2: 22 mEq/L (ref 19–32)
Calcium: 8.3 mg/dL — ABNORMAL LOW (ref 8.4–10.5)
Calcium: 8.3 mg/dL — ABNORMAL LOW (ref 8.4–10.5)
Chloride: 103 mEq/L (ref 96–112)
Creatinine, Ser: 1.42 mg/dL — ABNORMAL HIGH (ref 0.50–1.35)
Creatinine, Ser: 1.39 mg/dL — ABNORMAL HIGH (ref 0.50–1.35)
GFR calc Af Amer: 80 mL/min — ABNORMAL LOW (ref >90)
GFR calc Af Amer: 82 mL/min — ABNORMAL LOW (ref >90)
GFR calc non Af Amer: 69 mL/min — ABNORMAL LOW (ref >90)
GFR calc non Af Amer: 71 mL/min — ABNORMAL LOW (ref >90)
Glucose, Bld: 92 mg/dL (ref 70–99)
Glucose, Bld: 97 mg/dL (ref 70–99)
Potassium: 4 mEq/L (ref 3.5–5.1)
Potassium: 3.8 mEq/L (ref 3.5–5.1)
Sodium: 138 mEq/L (ref 135–145)

## 2011-09-23 LAB — URINALYSIS, ROUTINE W REFLEX MICROSCOPIC
Bilirubin Urine: NEGATIVE
Ketones, ur: >80 mg/dL — AB
Leukocytes, UA: NEGATIVE
Nitrite: NEGATIVE
Specific Gravity, Urine: 1.021 (ref 1.005–1.030)
Urobilinogen, UA: 1 mg/dL (ref 0.0–1.0)
pH: 5.5 (ref 5.0–8.0)

## 2011-09-23 LAB — URINE MICROSCOPIC-ADD ON

## 2011-09-23 MED ORDER — ACETAMINOPHEN 325 MG PO TABS
650.0000 mg | ORAL_TABLET | Freq: Four times a day (QID) | ORAL | Status: DC | PRN
  Administered 2011-09-23 – 2011-09-26 (×8): 650 mg via ORAL
  Filled 2011-09-23 (×8): qty 2

## 2011-09-23 MED ORDER — SODIUM CHLORIDE 0.9 % IJ SOLN
INTRAMUSCULAR | Status: AC
  Administered 2011-09-23: 14:00:00
  Filled 2011-09-23: qty 10

## 2011-09-23 NOTE — Progress Notes (Addendum)
Patient ID: Richard Guerra, male   DOB: 28-Sep-1990, 21 y.o.   MRN: [id number]    Subjective: "Hunger pains", stable overnight  Objective: Vital signs in last 24 hours: Temp:  [98.1 F (36.7 C)-98.7 F (37.1 C)] 98.6 F (37 C) (06/07 0745) Pulse Rate:  [61-93] 76  (06/07 0700) Resp:  [11-23] 16  (06/07 0700) BP: (97-128)/(39-70) 113/52 mmHg (06/07 0700) SpO2:  [97 %-100 %] 99 % (06/07 0700)    Intake/Output from previous day: 06/06 0701 - 06/07 0700 In: 2700 [I.V.:2300; IV Piggyback:400] Out: 1400 [Urine:1050; Emesis/NG output:350] Intake/Output this shift:    General appearance: alert and cooperative Resp: clear to auscultation bilaterally Cardio: regular rate and rhythm GI: soft, active BS, tender RUQ without guarding, R flank not sig tender, not tender elsewhere Extremities: calves soft  Lab Results: CBC   Basename 09/23/11 0335 09/22/11 1817  WBC 17.3* 20.0*  HGB 10.1* 11.0*  HCT 30.5* 32.7*  PLT 157 157   BMET  Basename 09/23/11 0335 09/22/11 1634  NA 138 137  K 4.0 4.5  CL 103 103  CO2 22 27  GLUCOSE 92 104*  BUN 16 17  CREATININE 1.42* 1.52*  CALCIUM 8.3* 8.5   PT/INR  Basename 09/21/11 1650  LABPROT 14.4  INR 1.10   ABG No results found for this basename: PHART:2,PCO2:2,PO2:2,HCO3:2 in the last 72 hours  Studies/Results: Dg Chest 2 View  09/21/2011  *RADIOLOGY REPORT*  Clinical Data: Trauma to abdomen.  Severe abdominal pain. Fractured kidney.  CHEST - 2 VIEW  Comparison: None.  Findings: The heart size is normal.  The lungs are clear.  The visualized soft tissues and bony thorax are unremarkable.  IMPRESSION: Negative chest.  Original Report Authenticated By: Jamesetta Orleans. MATTERN, M.D.   Ct Abdomen Pelvis W Contrast  09/21/2011  *RADIOLOGY REPORT*  Clinical Data: History of blunt trauma to the abdomen complaining of abdominal pain.  CT ABDOMEN AND PELVIS WITH CONTRAST  Technique:  Multidetector CT imaging of the abdomen and pelvis was performed  following the standard protocol during bolus administration of intravenous contrast.  Contrast: 80mL OMNIPAQUE IOHEXOL 300 MG/ML  SOLN  Comparison: No priors.  Findings:  Lung Bases: Unremarkable.  Abdomen/Pelvis:  Portions of the right kidney fail to enhance, and there is a large amount of high attenuation material surrounding the right kidney filling the perinephric space and portions of the right anterior pararenal space, compatible with a large perinephric hemorrhage.  The hemorrhage extends inferiorly into the right hemi pelvis via the retroperitoneum. Within the perinephric hemorrhage there are some foci of higher attenuation (approximately 73 HU), that are suspicious for but not definitive for persistent active extravasation.  The right renal artery and right renal vein appears severely attenuated in the right renal hilum.  There does appear to be a single right renal artery, giving off a small branch to the lower pole (best demonstrated on images 33 - 39 of series 6).  In addition, there is subtle decreased enhancement in the posterior aspect of the right lobe of the liver (segments of six and seven), suggestive of a contusion.  The appearance of the gallbladder, pancreas, spleen, left adrenal gland and left kidney is unremarkable.  The right adrenal gland is difficult to visualize, but appears to be normal.  There is no pneumoperitoneum.  No pathologic distension of bowel. No definite pathologic lymphadenopathy identified within the abdomen or pelvis.  The patient does have a right inguinal hernia which appears to contain only fat.  Prostate  urinary bladder are unremarkable.  Musculoskeletal: No acute displaced fractures are noted in the visualized portions of the skeleton. There are no aggressive appearing lytic or blastic lesions noted in the visualized portions of the skeleton.  IMPRESSION: 1.  Findings, as above, consistent with severe right renal laceration (a fractured kidney) with a very large  right-sided perinephric hemorrhage with extension of hemorrhage through the anterior pararenal space of the retroperitoneum on the right side into the right hemi pelvis.  2.  In addition, there appears to be a contusion of the posterior aspect of the right hepatic lobe. 3.  Small right inguinal hernia containing only fat incidentally noted.  Critical Value/emergent results were called by telephone at the time of interpretation on 09/21/2011  at 07:50 p.m.  to  Dr. Effie Shy, who verbally acknowledged these results.  Original Report Authenticated By: Florencia Reasons, M.D.    Anti-infectives: Anti-infectives     Start     Dose/Rate Route Frequency Ordered Stop   09/22/11 1000   ciprofloxacin (CIPRO) IVPB 400 mg  Status:  Discontinued        400 mg 200 mL/hr over 60 Minutes Intravenous Every 12 hours 09/21/11 2108 09/21/11 2109   09/22/11 1000   ciprofloxacin (CIPRO) IVPB 400 mg        400 mg 200 mL/hr over 60 Minutes Intravenous Every 12 hours 09/21/11 2110     09/21/11 2100   ciprofloxacin (CIPRO) IVPB 400 mg  Status:  Discontinued        400 mg 200 mL/hr over 60 Minutes Intravenous Every 24 hours 09/21/11 2036 09/21/11 2108          Assessment/Plan: Blunt abdominal trauma R renal laceration - appreciate input from Dr. McDiarmid, continue bedrest, Hb down some more but plts stable so I suspect bleeding stopped; crt slightly better; continue q12 CBC/BMET ID - Cipro Liver contusion grade 2 Ileus - improved, try clears VTE - PAS transfer to 3300  LOS: 2 days    Violeta Gelinas, MD, MPH, FACS Pager: [phone]  09/23/2011

## 2011-09-23 NOTE — Progress Notes (Signed)
Notified the MD that the patients temp was 101.9. MD ordered UA, blood cultures, and tylenol PO. Gave the Tylenol and sent the urine. Will continue to monitor the patient. Gave the patient an IS and encouraged the patient to use each hour. Will recheck the temp in one hour.

## 2011-09-23 NOTE — Progress Notes (Signed)
Patient temp 100.4 at 1955, pt educated about IS, the importance of ambulation and TCDB exercises. 650mg  PO tylenol had been given by dayshift RN. All other VSS at this time. Will continue to monitor patient and encourage movement, IS, and TCDB.

## 2011-09-24 LAB — BASIC METABOLIC PANEL
Calcium: 8.1 mg/dL — ABNORMAL LOW (ref 8.4–10.5)
GFR calc Af Amer: 79 mL/min — ABNORMAL LOW (ref >90)
GFR calc non Af Amer: 69 mL/min — ABNORMAL LOW (ref >90)
Glucose, Bld: 89 mg/dL (ref 70–99)
Potassium: 3.7 mEq/L (ref 3.5–5.1)
Sodium: 135 mEq/L (ref 135–145)

## 2011-09-24 LAB — CBC
Hemoglobin: 9 g/dL — ABNORMAL LOW (ref 13.0–17.0)
MCH: 22.1 pg — ABNORMAL LOW (ref 26.0–34.0)
MCHC: 32.7 g/dL (ref 30.0–36.0)
Platelets: 130 10*3/uL — ABNORMAL LOW (ref 150–400)
RDW: 14.3 % (ref 11.5–15.5)

## 2011-09-24 MED ORDER — OXYCODONE HCL 5 MG PO TABS
10.0000 mg | ORAL_TABLET | ORAL | Status: DC | PRN
  Administered 2011-09-24 – 2011-09-25 (×7): 10 mg via ORAL
  Filled 2011-09-24 (×7): qty 2

## 2011-09-24 NOTE — Progress Notes (Signed)
Trauma Service Note  Subjective: Patient says that his abdomen is better this morning.  Objective: Vital signs in last 24 hours: Temp:  [98.1 F (36.7 C)-101.9 F (38.8 C)] 100.6 F (38.1 C) (06/08 0700) Pulse Rate:  [81-103] 100  (06/08 0813) Resp:  [17-30] 25  (06/08 0813) BP: (112-134)/(52-75) 125/73 mmHg (06/08 0723) SpO2:  [91 %-98 %] 94 % (06/08 0813) Weight:  [71.6 kg (157 lb 13.6 oz)] 71.6 kg (157 lb 13.6 oz) (06/07 1200) Last BM Date: 09/21/11  Intake/Output from previous day: 06/07 0701 - 06/08 0700 In: 2920 [P.O.:320; I.V.:2400; IV Piggyback:200] Out: 1350 [Urine:1250; Emesis/NG output:100] Intake/Output this shift:    General: Sleepy, but no acute distress  Lungs: Clear  Abd: Distended, good bowel sounds, tender to palpation.  Extremities: No DVT signs or symptoms, but did not have SCDs in place  Neuro: Intact  Lab Results: CBC   Basename 09/24/11 0345 09/23/11 1512  WBC 17.5* 15.4*  HGB 9.0* 9.2*  HCT 27.5* 28.1*  PLT 130* 128*   BMET  Basename 09/24/11 0345 09/23/11 1512  NA 135 138  K 3.7 3.8  CL 100 103  CO2 24 24  GLUCOSE 89 97  BUN 12 13  CREATININE 1.43* 1.39*  CALCIUM 8.1* 8.3*   PT/INR  Basename 09/21/11 1650  LABPROT 14.4  INR 1.10   ABG No results found for this basename: PHART:2,PCO2:2,PO2:2,HCO3:2 in the last 72 hours  Studies/Results: No results found.  Anti-infectives: Anti-infectives     Start     Dose/Rate Route Frequency Ordered Stop   09/22/11 1000   ciprofloxacin (CIPRO) IVPB 400 mg  Status:  Discontinued        400 mg 200 mL/hr over 60 Minutes Intravenous Every 12 hours 09/21/11 2108 09/21/11 2109   09/22/11 1000   ciprofloxacin (CIPRO) IVPB 400 mg        400 mg 200 mL/hr over 60 Minutes Intravenous Every 12 hours 09/21/11 2110     09/21/11 2100   ciprofloxacin (CIPRO) IVPB 400 mg  Status:  Discontinued        400 mg 200 mL/hr over 60 Minutes Intravenous Every 24 hours 09/21/11 2036 09/21/11 2108          Assessment/Plan: s/p  SCDs Decrease IVFs to 75 Repeat CBC tomorrow AM Bedrest with BRP Oral pain medication Re-examine later today.  LOS: 3 days   Marta Lamas. Gae Bon, MD, FACS [phone] Trauma Surgeon 09/24/2011

## 2011-09-24 NOTE — Progress Notes (Signed)
Pt given 650mg  PO tylenol for fever, will reassess and continue to monitor.

## 2011-09-24 NOTE — Progress Notes (Signed)
Yesterday patient sleeping when i saw patient Reviewed Dr Mickie Bail note Spoke with Dr Laurell Josephs Temp 100.6 WBC remains elevated  Hb 9.0 Cr 1.43 HR 100; BP OK Urine output OK yesterday - bit lower Plan: will see later today

## 2011-09-25 LAB — DIFFERENTIAL
Basophils Relative: 0 % (ref 0–1)
Eosinophils Relative: 0 % (ref 0–5)
Lymphocytes Relative: 10 % — ABNORMAL LOW (ref 12–46)
Neutro Abs: 12.9 10*3/uL — ABNORMAL HIGH (ref 1.7–7.7)
Neutrophils Relative %: 80 % — ABNORMAL HIGH (ref 43–77)

## 2011-09-25 LAB — CBC
HCT: 27.6 % — ABNORMAL LOW (ref 39.0–52.0)
Hemoglobin: 9.1 g/dL — ABNORMAL LOW (ref 13.0–17.0)
RBC: 4.07 MIL/uL — ABNORMAL LOW (ref 4.22–5.81)

## 2011-09-25 MED ORDER — OXYCODONE HCL 5 MG PO TABS
5.0000 mg | ORAL_TABLET | ORAL | Status: DC | PRN
  Administered 2011-09-25: 15 mg via ORAL
  Administered 2011-09-25: 5 mg via ORAL
  Administered 2011-09-25: 15 mg via ORAL
  Administered 2011-09-26: 20 mg via ORAL
  Administered 2011-09-26: 15 mg via ORAL
  Administered 2011-09-26: 20 mg via ORAL
  Filled 2011-09-25: qty 1
  Filled 2011-09-25 (×2): qty 4
  Filled 2011-09-25 (×2): qty 3
  Filled 2011-09-25: qty 4

## 2011-09-25 MED ORDER — PANTOPRAZOLE SODIUM 40 MG PO TBEC
40.0000 mg | DELAYED_RELEASE_TABLET | Freq: Every day | ORAL | Status: DC
  Administered 2011-09-25: 40 mg via ORAL
  Filled 2011-09-25: qty 1

## 2011-09-25 NOTE — Progress Notes (Signed)
Pt tx 5100 per MD order, pt VSS, pt verbalized understanding of tx, family updated on tx, report called to receiving RN, all questions answered

## 2011-09-25 NOTE — Progress Notes (Signed)
Pt requests that SCDs be removed for a short time; Compression devices removed per request @ 0215; will continue to monitor

## 2011-09-25 NOTE — Progress Notes (Signed)
Vitals good Pain improved Eating Hb stable Good progress
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
## 2011-09-25 NOTE — Clinical Social Work Psychosocial (Signed)
     Clinical Social Work Department BRIEF PSYCHOSOCIAL ASSESSMENT 09/25/2011  Patient:  Richard Guerra, Richard Guerra     Account Number:  [id number]     Admit date:  09/21/2011  Clinical Social Worker:  Margaree Mackintosh  Date/Time:  09/25/2011 02:52 PM  Referred by:  Physician  Date Referred:  09/23/2011 Referred for  Substance Abuse   Other Referral:   Interview type:  Patient Other interview type:   with mother present.    PSYCHOSOCIAL DATA Living Status:  FAMILY Admitted from facility:   Level of care:   Primary support name:  [phone] Primary support relationship to patient:  PARENT Degree of support available:   Adequate.    CURRENT CONCERNS Current Concerns  Substance Abuse   Other Concerns:    SOCIAL WORK ASSESSMENT / PLAN Clinical Social Worker met with pt and mother at bedside. CSW recieved permission to speak openly with mother present.  CSW explained Trauma policy to complete SBIRTS on all pts.  Pt denied any use of ETOH or substances.  Pt states, "I don't do that stuff".  CSW offered support and reviewed pt's questions/concerns.  Pt's only complaint is of feeling in pain.  CSW notified RN.  CSW to sign off, please re consult if needed.   Assessment/plan status:  Information/Referral to [facility] Other assessment/ plan:   Information/referral to community resources:   SBIRT    PATIENTS/FAMILYS RESPONSE TO PLAN OF CARE: Pt and mother were pleasant and approprately engaged. Pt and mother appeared receptive to CSW intervention and thanked CSW for information.

## 2011-09-26 ENCOUNTER — Inpatient Hospital Stay (HOSPITAL_COMMUNITY): Payer: Federal, State, Local not specified - PPO

## 2011-09-26 ENCOUNTER — Encounter (HOSPITAL_COMMUNITY): Payer: Self-pay | Admitting: Radiology

## 2011-09-26 DIAGNOSIS — D62 Acute posthemorrhagic anemia: Secondary | ICD-10-CM | POA: Diagnosis not present

## 2011-09-26 DIAGNOSIS — T1490XA Injury, unspecified, initial encounter: Secondary | ICD-10-CM | POA: Diagnosis present

## 2011-09-26 DIAGNOSIS — S36114A Minor laceration of liver, initial encounter: Secondary | ICD-10-CM | POA: Diagnosis present

## 2011-09-26 LAB — CBC
HCT: 27.7 % — ABNORMAL LOW (ref 39.0–52.0)
Hemoglobin: 9.5 g/dL — ABNORMAL LOW (ref 13.0–17.0)
MCV: 66.7 fL — ABNORMAL LOW (ref 78.0–100.0)
RBC: 4.15 MIL/uL — ABNORMAL LOW (ref 4.22–5.81)
WBC: 16.4 10*3/uL — ABNORMAL HIGH (ref 4.0–10.5)

## 2011-09-26 LAB — BASIC METABOLIC PANEL
BUN: 11 mg/dL (ref 6–23)
CO2: 25 mEq/L (ref 19–32)
Chloride: 97 mEq/L (ref 96–112)
GFR calc Af Amer: 84 mL/min — ABNORMAL LOW (ref >90)
Glucose, Bld: 107 mg/dL — ABNORMAL HIGH (ref 70–99)
Potassium: 3.6 mEq/L (ref 3.5–5.1)

## 2011-09-26 MED ORDER — CIPROFLOXACIN HCL 250 MG PO TABS: 250.0000 mg | ORAL_TABLET | Freq: Two times a day (BID) | ORAL | Status: AC

## 2011-09-26 MED ORDER — IOHEXOL 350 MG/ML SOLN
100.0000 mL | Freq: Once | INTRAVENOUS | Status: AC | PRN
  Administered 2011-09-26: 100 mL via INTRAVENOUS

## 2011-09-26 MED ORDER — OXYCODONE-ACETAMINOPHEN 10-325 MG PO TABS: 1.0000 | ORAL_TABLET | ORAL | Status: AC | PRN

## 2011-09-26 NOTE — Progress Notes (Signed)
Patient discharged to home in care of mother and grandfather. Medications and instructions reviewed with patient and family with all questions answered. IV's d/c'd with cath intact. Assessment unchanged from this am. Patient is to follow up with Dr. McDiarmid in 1 week and with Trauma on 6.27.13 @ 2pm.
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
## 2011-09-26 NOTE — Progress Notes (Signed)
UR complete 

## 2011-09-26 NOTE — Progress Notes (Signed)
The patient's CT does not show a PE from my review, but I am awaiting the official report from the radiology. It does show that he has a significant effusion on the right that should be drained.  Currently the patient is refusing anymore treatment.  He is frustrated and wants to go home.   We will await further information.  This patient has been seen and I agree with the findings and treatment plan.  Marta Lamas. Gae Bon, MD, FACS [phone] (pager) [phone] (direct pager) Trauma Surgeon.

## 2011-09-26 NOTE — Discharge Instructions (Signed)
No lifting for 2 weeks.  No running, jumping, ball or contact sports, bikes, skateboards, etc for 2 months.  No driving while on oxycodone.
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
## 2011-09-28 ENCOUNTER — Telehealth (INDEPENDENT_AMBULATORY_CARE_PROVIDER_SITE_OTHER): Payer: Self-pay | Admitting: General Surgery

## 2011-09-28 NOTE — Discharge Summary (Signed)
Okay to go home without thoracentesis  This patient has been seen and I agree with the findings and treatment plan.  Marta Lamas. Gae Bon, MD, FACS [phone] (pager) [phone] (direct pager) Trauma Surgeon

## 2011-09-28 NOTE — Telephone Encounter (Signed)
Pt calling for pain meds; instructed to call Trauma Clinic and given the phone number.  He called back nearly immediately because he got the answering machine.  Paged and updated Dr. Lindie Spruce, who stated he needed to call the Trauma Clinic. I called and left a message on the TC answering machine.  Pt is aware.

## 2011-09-29 ENCOUNTER — Telehealth (INDEPENDENT_AMBULATORY_CARE_PROVIDER_SITE_OTHER): Payer: Self-pay | Admitting: Orthopedic Surgery

## 2011-09-29 NOTE — Telephone Encounter (Signed)
Pt was confused about dosing of percocet. I had incorrectly said to take 1 every 4 hours. I told him he could take 1-2 every 4 hours.

## 2011-09-29 NOTE — Telephone Encounter (Signed)
Left message

## 2011-09-30 LAB — CULTURE, BLOOD (ROUTINE X 2)
Culture  Setup Time: 201306080135
Culture: NO GROWTH

## 2011-10-04 ENCOUNTER — Telehealth: Payer: Self-pay | Admitting: Orthopedic Surgery

## 2011-10-04 MED ORDER — HYDROCODONE-ACETAMINOPHEN 10-325 MG PO TABS: 1.0000 | ORAL_TABLET | ORAL | Status: AC | PRN

## 2011-10-04 NOTE — Telephone Encounter (Signed)
Called in rx for Norco 10, #50.

## 2011-10-13 ENCOUNTER — Encounter (INDEPENDENT_AMBULATORY_CARE_PROVIDER_SITE_OTHER): Payer: Self-pay

## 2011-10-13 ENCOUNTER — Ambulatory Visit (INDEPENDENT_AMBULATORY_CARE_PROVIDER_SITE_OTHER): Payer: Federal, State, Local not specified - PPO | Admitting: Orthopedic Surgery

## 2011-10-13 VITALS — BP 92/60 | HR 60 | Resp 14 | Ht 65.0 in | Wt 143.0 lb

## 2011-10-13 DIAGNOSIS — S36114A Minor laceration of liver, initial encounter: Secondary | ICD-10-CM

## 2011-10-13 DIAGNOSIS — S36113A Laceration of liver, unspecified degree, initial encounter: Secondary | ICD-10-CM

## 2011-10-13 DIAGNOSIS — K4090 Unilateral inguinal hernia, without obstruction or gangrene, not specified as recurrent: Secondary | ICD-10-CM

## 2011-10-13 DIAGNOSIS — N2889 Other specified disorders of kidney and ureter: Secondary | ICD-10-CM

## 2011-10-13 MED ORDER — TRAMADOL HCL 50 MG PO TABS: 50.0000 mg | ORAL_TABLET | Freq: Four times a day (QID) | ORAL | Status: DC | PRN
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
## 2011-10-13 NOTE — Patient Instructions (Signed)
No running, jumping, ball or contact sports, bikes, skateboards, lifting greater than 10 pounds for 6 weeks from the date of injury.

## 2012-03-17 ENCOUNTER — Encounter (HOSPITAL_COMMUNITY)
Admission: EM | Disposition: A | Discharge status: Home / Self Care | Payer: Self-pay | Source: Home / Self Care | Attending: Emergency Medicine

## 2012-03-17 ENCOUNTER — Encounter (HOSPITAL_COMMUNITY): Payer: Self-pay | Admitting: Anesthesiology

## 2012-03-17 ENCOUNTER — Emergency Department (HOSPITAL_COMMUNITY): Payer: Federal, State, Local not specified - PPO | Admitting: Anesthesiology

## 2012-03-17 ENCOUNTER — Observation Stay (HOSPITAL_COMMUNITY)
Admission: EM | Admit: 2012-03-17 | Discharge: 2012-03-18 | Disposition: A | Discharge status: Home / Self Care | Payer: Federal, State, Local not specified - PPO | Attending: General Surgery | Admitting: General Surgery

## 2012-03-17 ENCOUNTER — Emergency Department (HOSPITAL_COMMUNITY): Payer: Federal, State, Local not specified - PPO

## 2012-03-17 ENCOUNTER — Encounter (HOSPITAL_COMMUNITY): Payer: Self-pay | Admitting: *Deleted

## 2012-03-17 DIAGNOSIS — Z01812 Encounter for preprocedural laboratory examination: Secondary | ICD-10-CM | POA: Insufficient documentation

## 2012-03-17 DIAGNOSIS — K4030 Unilateral inguinal hernia, with obstruction, without gangrene, not specified as recurrent: Secondary | ICD-10-CM

## 2012-03-17 DIAGNOSIS — Z01810 Encounter for preprocedural cardiovascular examination: Secondary | ICD-10-CM | POA: Insufficient documentation

## 2012-03-17 HISTORY — PX: INSERTION OF MESH: SHX5868

## 2012-03-17 HISTORY — PX: INGUINAL HERNIA REPAIR: SHX194

## 2012-03-17 LAB — COMPREHENSIVE METABOLIC PANEL
Albumin: 4.4 g/dL (ref 3.5–5.2)
BUN: 19 mg/dL (ref 6–23)
Calcium: 9.6 mg/dL (ref 8.4–10.5)
Creatinine, Ser: 1.18 mg/dL (ref 0.50–1.35)
Total Bilirubin: 0.4 mg/dL (ref 0.3–1.2)
Total Protein: 7.6 g/dL (ref 6.0–8.3)

## 2012-03-17 LAB — URINALYSIS, ROUTINE W REFLEX MICROSCOPIC
Ketones, ur: NEGATIVE mg/dL
Leukocytes, UA: NEGATIVE
Nitrite: NEGATIVE
pH: 8 (ref 5.0–8.0)

## 2012-03-17 LAB — CBC WITH DIFFERENTIAL/PLATELET
Basophils Absolute: 0 10*3/uL (ref 0.0–0.1)
Eosinophils Absolute: 0.1 10*3/uL (ref 0.0–0.7)
HCT: 47.5 % (ref 39.0–52.0)
Lymphs Abs: 1.9 10*3/uL (ref 0.7–4.0)
MCHC: 33.9 g/dL (ref 30.0–36.0)
MCV: 65.7 fL — ABNORMAL LOW (ref 78.0–100.0)
Monocytes Absolute: 0.7 10*3/uL (ref 0.1–1.0)
Neutro Abs: 8.5 10*3/uL — ABNORMAL HIGH (ref 1.7–7.7)
RDW: 14.4 % (ref 11.5–15.5)

## 2012-03-17 LAB — LIPASE, BLOOD: Lipase: 42 U/L (ref 11–59)

## 2012-03-17 SURGERY — REPAIR, HERNIA, INGUINAL, ADULT
Anesthesia: General | Site: Groin | Laterality: Right | Wound class: Clean

## 2012-03-17 MED ORDER — GLYCOPYRROLATE 0.2 MG/ML IJ SOLN
INTRAMUSCULAR | Status: DC | PRN
  Administered 2012-03-17: 0.4 mg via INTRAVENOUS

## 2012-03-17 MED ORDER — PROMETHAZINE HCL 25 MG/ML IJ SOLN: 6.2500 mg | INTRAMUSCULAR | Status: DC | PRN

## 2012-03-17 MED ORDER — LACTATED RINGERS IV SOLN
INTRAVENOUS | Status: DC | PRN
  Administered 2012-03-17 (×2): via INTRAVENOUS

## 2012-03-17 MED ORDER — HYDROMORPHONE HCL PF 1 MG/ML IJ SOLN: 0.2500 mg | INTRAMUSCULAR | Status: DC | PRN

## 2012-03-17 MED ORDER — ROCURONIUM BROMIDE 100 MG/10ML IV SOLN
INTRAVENOUS | Status: DC | PRN
  Administered 2012-03-17: 5 mg via INTRAVENOUS
  Administered 2012-03-17: 10 mg via INTRAVENOUS
  Administered 2012-03-17: 30 mg via INTRAVENOUS

## 2012-03-17 MED ORDER — SODIUM CHLORIDE 0.9 % IV BOLUS (SEPSIS)
1000.0000 mL | Freq: Once | INTRAVENOUS | Status: AC
  Administered 2012-03-17: 1000 mL via INTRAVENOUS

## 2012-03-17 MED ORDER — ETOMIDATE 2 MG/ML IV SOLN
INTRAVENOUS | Status: AC
  Administered 2012-03-17: 10 mg
  Filled 2012-03-17: qty 10

## 2012-03-17 MED ORDER — FENTANYL CITRATE 0.05 MG/ML IJ SOLN
INTRAMUSCULAR | Status: DC | PRN
  Administered 2012-03-17 (×2): 50 ug via INTRAVENOUS
  Administered 2012-03-17: 100 ug via INTRAVENOUS

## 2012-03-17 MED ORDER — HYDROMORPHONE HCL PF 1 MG/ML IJ SOLN
1.0000 mg | Freq: Once | INTRAMUSCULAR | Status: AC
  Administered 2012-03-17: 1 mg via INTRAVENOUS
  Filled 2012-03-17: qty 1

## 2012-03-17 MED ORDER — OXYCODONE HCL 5 MG/5ML PO SOLN
5.0000 mg | Freq: Once | ORAL | Status: AC | PRN
  Filled 2012-03-17: qty 5

## 2012-03-17 MED ORDER — OXYCODONE HCL 5 MG PO TABS: 5.0000 mg | ORAL_TABLET | Freq: Once | ORAL | Status: AC | PRN

## 2012-03-17 MED ORDER — BUPIVACAINE HCL (PF) 0.25 % IJ SOLN
INTRAMUSCULAR | Status: DC | PRN
  Administered 2012-03-17: 20 mL

## 2012-03-17 MED ORDER — HYDROMORPHONE HCL PF 1 MG/ML IJ SOLN
1.0000 mg | INTRAMUSCULAR | Status: DC | PRN
  Administered 2012-03-18 (×3): 1 mg via INTRAVENOUS
  Filled 2012-03-17 (×4): qty 1

## 2012-03-17 MED ORDER — PROPOFOL 10 MG/ML IV BOLUS
INTRAVENOUS | Status: DC | PRN
  Administered 2012-03-17: 200 mg via INTRAVENOUS

## 2012-03-17 MED ORDER — CEFAZOLIN SODIUM-DEXTROSE 2-3 GM-% IV SOLR
2.0000 g | INTRAVENOUS | Status: AC
  Administered 2012-03-17: 2 g via INTRAVENOUS
  Filled 2012-03-17: qty 50

## 2012-03-17 MED ORDER — ONDANSETRON HCL 4 MG/2ML IJ SOLN
INTRAMUSCULAR | Status: DC | PRN
  Administered 2012-03-17: 4 mg via INTRAVENOUS

## 2012-03-17 MED ORDER — ACETAMINOPHEN 10 MG/ML IV SOLN: 1000.0000 mg | Freq: Once | INTRAVENOUS | Status: AC | PRN

## 2012-03-17 MED ORDER — ONDANSETRON HCL 4 MG/2ML IJ SOLN: 4.0000 mg | Freq: Four times a day (QID) | INTRAMUSCULAR | Status: DC | PRN

## 2012-03-17 MED ORDER — BUPIVACAINE-EPINEPHRINE PF 0.25-1:200000 % IJ SOLN
INTRAMUSCULAR | Status: AC
  Filled 2012-03-17: qty 30

## 2012-03-17 MED ORDER — ONDANSETRON HCL 4 MG/2ML IJ SOLN
4.0000 mg | Freq: Once | INTRAMUSCULAR | Status: AC
  Administered 2012-03-17: 4 mg via INTRAVENOUS
  Filled 2012-03-17: qty 2

## 2012-03-17 MED ORDER — ONDANSETRON HCL 4 MG PO TABS
4.0000 mg | ORAL_TABLET | Freq: Four times a day (QID) | ORAL | Status: DC | PRN
  Filled 2012-03-17: qty 1

## 2012-03-17 MED ORDER — KCL IN DEXTROSE-NACL 20-5-0.45 MEQ/L-%-% IV SOLN
INTRAVENOUS | Status: DC
  Administered 2012-03-18: 03:00:00 via INTRAVENOUS
  Filled 2012-03-17 (×2): qty 1000

## 2012-03-17 MED ORDER — 0.9 % SODIUM CHLORIDE (POUR BTL) OPTIME
TOPICAL | Status: DC | PRN
  Administered 2012-03-17: 1000 mL

## 2012-03-17 MED ORDER — LIDOCAINE HCL (CARDIAC) 20 MG/ML IV SOLN
INTRAVENOUS | Status: DC | PRN
  Administered 2012-03-17: 60 mg via INTRAVENOUS

## 2012-03-17 MED ORDER — SUCCINYLCHOLINE CHLORIDE 20 MG/ML IJ SOLN
INTRAMUSCULAR | Status: DC | PRN
  Administered 2012-03-17: 100 mg via INTRAVENOUS

## 2012-03-17 MED ORDER — CEFAZOLIN SODIUM 1-5 GM-% IV SOLN
1.0000 g | Freq: Four times a day (QID) | INTRAVENOUS | Status: DC
  Administered 2012-03-18 (×2): 1 g via INTRAVENOUS
  Filled 2012-03-17 (×3): qty 50

## 2012-03-17 MED ORDER — NEOSTIGMINE METHYLSULFATE 1 MG/ML IJ SOLN
INTRAMUSCULAR | Status: DC | PRN
  Administered 2012-03-17: 3 mg via INTRAVENOUS

## 2012-03-17 MED ORDER — IOHEXOL 300 MG/ML  SOLN
100.0000 mL | Freq: Once | INTRAMUSCULAR | Status: AC | PRN
  Administered 2012-03-17: 100 mL via INTRAVENOUS

## 2012-03-17 MED ORDER — HYDROMORPHONE HCL PF 1 MG/ML IJ SOLN
1.0000 mg | Freq: Once | INTRAMUSCULAR | Status: AC
Start: 2012-03-17 — End: 2012-03-17
  Administered 2012-03-17: 1 mg via INTRAVENOUS
  Filled 2012-03-17: qty 1

## 2012-03-17 MED ORDER — MEPERIDINE HCL 50 MG/ML IJ SOLN: 6.2500 mg | INTRAMUSCULAR | Status: DC | PRN

## 2012-03-17 SURGICAL SUPPLY — 47 items
APL SKNCLS STERI-STRIP NONHPOA (GAUZE/BANDAGES/DRESSINGS) ×2
BENZOIN TINCTURE PRP APPL 2/3 (GAUZE/BANDAGES/DRESSINGS) ×3 IMPLANT
BLADE HEX COATED 2.75 (ELECTRODE) ×3 IMPLANT
CHLORAPREP W/TINT 26ML (MISCELLANEOUS) ×3 IMPLANT
CLOTH BEACON ORANGE TIMEOUT ST (SAFETY) ×3 IMPLANT
CLSR STERI-STRIP ANTIMIC 1/2X4 (GAUZE/BANDAGES/DRESSINGS) ×1 IMPLANT
COVER SURGICAL LIGHT HANDLE (MISCELLANEOUS) ×3 IMPLANT
DECANTER SPIKE VIAL GLASS SM (MISCELLANEOUS) ×3 IMPLANT
DISSECTOR ROUND CHERRY 3/8 STR (MISCELLANEOUS) ×2 IMPLANT
DRAIN PENROSE 18X1/2 LTX STRL (DRAIN) ×1 IMPLANT
DRAPE LAPAROTOMY TRNSV 102X78 (DRAPE) ×3 IMPLANT
DRAPE UTILITY XL STRL (DRAPES) ×3 IMPLANT
DRSG TEGADERM 4X4.75 (GAUZE/BANDAGES/DRESSINGS) IMPLANT
DRSG TEGADERM 8X12 (GAUZE/BANDAGES/DRESSINGS) ×1 IMPLANT
ELECT REM PT RETURN 9FT ADLT (ELECTROSURGICAL) ×3
ELECTRODE REM PT RTRN 9FT ADLT (ELECTROSURGICAL) ×2 IMPLANT
GAUZE SPONGE 4X4 16PLY XRAY LF (GAUZE/BANDAGES/DRESSINGS) IMPLANT
GLOVE BIO SURGEON STRL SZ7 (GLOVE) ×3 IMPLANT
GLOVE BIOGEL PI IND STRL 7.5 (GLOVE) ×2 IMPLANT
GLOVE BIOGEL PI INDICATOR 7.5 (GLOVE) ×1
GOWN STRL NON-REIN LRG LVL3 (GOWN DISPOSABLE) ×6 IMPLANT
GOWN STRL REIN XL XLG (GOWN DISPOSABLE) ×3 IMPLANT
KIT BASIN OR (CUSTOM PROCEDURE TRAY) ×3 IMPLANT
LIGASURE IMPACT 36 18CM CVD LR (INSTRUMENTS) ×1 IMPLANT
MESH ULTRAPRO 3X6 7.6X15CM (Mesh General) ×1 IMPLANT
NDL HYPO 25X1 1.5 SAFETY (NEEDLE) ×2 IMPLANT
NEEDLE HYPO 25X1 1.5 SAFETY (NEEDLE) ×3 IMPLANT
NS IRRIG 1000ML POUR BTL (IV SOLUTION) ×3 IMPLANT
PACK GENERAL/GYN (CUSTOM PROCEDURE TRAY) ×3 IMPLANT
SPONGE GAUZE 4X4 12PLY (GAUZE/BANDAGES/DRESSINGS) ×3 IMPLANT
STRIP CLOSURE SKIN 1/2X4 (GAUZE/BANDAGES/DRESSINGS) ×3 IMPLANT
SUT MNCRL AB 4-0 PS2 18 (SUTURE) ×4 IMPLANT
SUT PROLENE 2 0 SH DA (SUTURE) ×4 IMPLANT
SUT SILK 2 0 SH (SUTURE) ×1 IMPLANT
SUT SILK 3 0 (SUTURE) ×3
SUT SILK 3-0 18XBRD TIE 12 (SUTURE) IMPLANT
SUT VIC AB 2-0 CT2 27 (SUTURE) IMPLANT
SUT VIC AB 2-0 SH 27 (SUTURE) ×3
SUT VIC AB 2-0 SH 27X BRD (SUTURE) ×2 IMPLANT
SUT VIC AB 3-0 SH 27 (SUTURE) ×6
SUT VIC AB 3-0 SH 27X BRD (SUTURE) IMPLANT
SUT VIC AB 3-0 SH 27XBRD (SUTURE) IMPLANT
SUT VICRYL 0 27 CT2 27 ABS (SUTURE) IMPLANT
SUT VICRYL 0 UR6 27IN ABS (SUTURE) IMPLANT
SYR CONTROL 10ML LL (SYRINGE) ×3 IMPLANT
TOWEL OR 17X26 10 PK STRL BLUE (TOWEL DISPOSABLE) ×6 IMPLANT
TRAY FOLEY CATH 14FRSI W/METER (CATHETERS) ×1 IMPLANT
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
## 2012-03-17 NOTE — Anesthesia Preprocedure Evaluation (Addendum)
Anesthesia Evaluation  Patient identified by MRN, date of birth, ID band Patient awake    Reviewed: Allergy & Precautions, H&P , NPO status , Patient's Chart, lab work & pertinent test results  Airway Mallampati: I TM Distance: >3 FB Neck ROM: Full    Dental  (+) Dental Advisory Given and Teeth Intact   Pulmonary neg pulmonary ROS,  breath sounds clear to auscultation  Pulmonary exam normal       Cardiovascular Exercise Tolerance: Good - CAD and - Past MI Rhythm:Regular Rate:Normal     Neuro/Psych Seizures -, Well Controlled,  negative psych ROS   GI/Hepatic negative GI ROS, Neg liver ROS,   Endo/Other  negative endocrine ROS  Renal/GU Renal diseasenegative Renal ROS     Musculoskeletal negative musculoskeletal ROS (+)   Abdominal   Peds  Hematology negative hematology ROS (+)   Anesthesia Other Findings   Reproductive/Obstetrics                        Anesthesia Physical Anesthesia Plan  ASA: II and emergent  Anesthesia Plan: General   Post-op Pain Management:    Induction: Intravenous  Airway Management Planned: Oral ETT  Additional Equipment:   Intra-op Plan:   Post-operative Plan: Extubation in OR  Informed Consent: I have reviewed the patients History and Physical, chart, labs and discussed the procedure including the risks, benefits and alternatives for the proposed anesthesia with the patient or authorized representative who has indicated his/her understanding and acceptance.   Dental advisory given  Plan Discussed with: CRNA  Anesthesia Plan Comments:         Anesthesia Quick Evaluation

## 2012-03-17 NOTE — ED Notes (Signed)
Pt to xray
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
## 2012-03-17 NOTE — ED Notes (Signed)
Pt alert and oriented x4. Respirations even and unlabored, bilateral symmetrical rise and fall of chest. Skin warm and dry. In no acute distress. Denies needs.   

## 2012-03-17 NOTE — H&P (Signed)
Richard Guerra is an 21 y.o. male.   Chief Complaint: Incarcerated right inguinal hernia HPI: 21 yo male was working out earlier today when he suddenly had right groin pain and scrotal swelling.  He has a known right inguinal hernia after a blunt trauma 6 months ago.  This was observed and apparently has not really bothered him much until today.  He is exquisitely tender.  Past Medical History  Diagnosis Date  . Febrile seizure     at age 85 months  . Concussion     at age 58 due to fall while playing football  . No pertinent past medical history     Past Surgical History  Procedure Date  . Inner ear surgery     tubes in both ears approx. 46 months old    History reviewed. No pertinent family history. Social History:  reports that he has never smoked. He has never used smokeless tobacco. He reports that he drinks alcohol. He reports that he uses illicit drugs (Marijuana).  Allergies: No Known Allergies  No meds   Results for orders placed during the hospital encounter of 03/17/12 (from the past 48 hour(s))  CBC WITH DIFFERENTIAL     Status: Abnormal   Collection Time   03/17/12  4:13 PM      Component Value Range Comment   WBC 11.2 (*) 4.0 - 10.5 K/uL    RBC 7.23 (*) 4.22 - 5.81 MIL/uL    Hemoglobin 16.1  13.0 - 17.0 g/dL    HCT 16.1  09.6 - 04.5 %    MCV 65.7 (*) 78.0 - 100.0 fL    MCH 22.3 (*) 26.0 - 34.0 pg    MCHC 33.9  30.0 - 36.0 g/dL    RDW 40.9  81.1 - 91.4 %    Platelets 247  150 - 400 K/uL    Neutrophils Relative 76  43 - 77 %    Lymphocytes Relative 17  12 - 46 %    Monocytes Relative 6  3 - 12 %    Eosinophils Relative 1  0 - 5 %    Basophils Relative 0  0 - 1 %    Neutro Abs 8.5 (*) 1.7 - 7.7 K/uL    Lymphs Abs 1.9  0.7 - 4.0 K/uL    Monocytes Absolute 0.7  0.1 - 1.0 K/uL    Eosinophils Absolute 0.1  0.0 - 0.7 K/uL    Basophils Absolute 0.0  0.0 - 0.1 K/uL    Smear Review MORPHOLOGY UNREMARKABLE     COMPREHENSIVE METABOLIC PANEL     Status: Abnormal     Collection Time   03/17/12  4:13 PM      Component Value Range Comment   Sodium 133 (*) 135 - 145 mEq/L    Potassium 3.7  3.5 - 5.1 mEq/L    Chloride 96  96 - 112 mEq/L    CO2 25  19 - 32 mEq/L    Glucose, Bld 125 (*) 70 - 99 mg/dL    BUN 19  6 - 23 mg/dL    Creatinine, Ser 7.82  0.50 - 1.35 mg/dL    Calcium 9.6  8.4 - 95.6 mg/dL    Total Protein 7.6  6.0 - 8.3 g/dL    Albumin 4.4  3.5 - 5.2 g/dL    AST 37  0 - 37 U/L    ALT 19  0 - 53 U/L    Alkaline Phosphatase 93  39 -  117 U/L    Total Bilirubin 0.4  0.3 - 1.2 mg/dL    GFR calc non Af Amer 86 (*) >90 mL/min    GFR calc Af Amer >90  >90 mL/min   LIPASE, BLOOD     Status: Normal   Collection Time   03/17/12  4:13 PM      Component Value Range Comment   Lipase 42  11 - 59 U/L   URINALYSIS, ROUTINE W REFLEX MICROSCOPIC     Status: Abnormal   Collection Time   03/17/12  5:25 PM      Component Value Range Comment   Color, Urine YELLOW  YELLOW    APPearance CLOUDY (*) CLEAR    Specific Gravity, Urine 1.022  1.005 - 1.030    pH 8.0  5.0 - 8.0    Glucose, UA NEGATIVE  NEGATIVE mg/dL    Hgb urine dipstick NEGATIVE  NEGATIVE    Bilirubin Urine NEGATIVE  NEGATIVE    Ketones, ur NEGATIVE  NEGATIVE mg/dL    Protein, ur NEGATIVE  NEGATIVE mg/dL    Urobilinogen, UA 0.2  0.0 - 1.0 mg/dL    Nitrite NEGATIVE  NEGATIVE    Leukocytes, UA NEGATIVE  NEGATIVE MICROSCOPIC NOT DONE ON URINES WITH NEGATIVE PROTEIN, BLOOD, LEUKOCYTES, NITRITE, OR GLUCOSE <1000 mg/dL.   Ct Abdomen Pelvis W Contrast  03/17/2012  *RADIOLOGY REPORT*  Clinical Data: Abdominal pain while lifting weights.  CT ABDOMEN AND PELVIS WITH CONTRAST  Technique:  Multidetector CT imaging of the abdomen and pelvis was performed following the standard protocol during bolus administration of intravenous contrast.  Contrast: OMNIPAQUE IOHEXOL 300 MG/ML  SOLN  Comparison: CT abdomen 02/21/2012.  CT abdomen 09/21/2011  Findings: Unenhanced scans were initially obtained in  error. Subsequently, routine enhanced study was performed.  Lung bases are clear.  Liver and spleen are normal.  Pancreas is normal.  Abnormality of the right lower kidney related to prior renal laceration which has healed with a defect in the right lower pole.  Left kidney is normal.   Large right inguinal hernia containing small bowel which is mildly distended and may be mildly thickened. Small amount of fluid in the hernia sac.  Hernia sac was present on prior studies but did not contain bowel.  The intra abdominal bowel is not dilated.  The appendix is normal.  No free fluid in the pelvis.  IMPRESSION: Right lower pole renal laceration which has partially healed from prior studies.  The right lower pole is functional but atrophic.  Large right inguinal hernia.  There may be an incarcerated loop of bowel within the hernia which is distended with bowel thickening.   Original Report Authenticated By: Janeece Riggers, M.D.    Dg Abd Acute W/chest  03/17/2012  *RADIOLOGY REPORT*  Clinical Data: Pain.  ACUTE ABDOMEN SERIES (ABDOMEN 2 VIEW & CHEST 1 VIEW)  Comparison: CT abdomen pelvis with and without contrast 02/21/2012  Findings: The heart, mediastinal, and hilar contours appear within normal limits.  The trachea is midline. Lung volumes are slightly low and the lungs appear clear.  No evidence of airspace disease or pneumothorax.  Negative for pleural effusion.  Right side up decubitus view is negative for free intraperitoneal air.  The bowel gas pattern is nonobstructive.  No abdominal mass effect is seen.  Radiopaque densities projecting over the pelvis are external to the patient, when correlated with prior abdominal scout view and abdominal CT 02/21/2012.  IMPRESSION:  1.  Nonobstructive bowel gas  pattern.  No free intraperitoneal air. 2.  No acute cardiopulmonary disease identified.   Original Report Authenticated By: Britta Mccreedy, M.D.     Review of Systems  Eyes: Negative.   Neurological: Negative.    Endo/Heme/Allergies: Negative.   Positive only for right groin pain  Blood pressure 168/80, pulse 65, temperature 97.9 F (36.6 C), temperature source Oral, resp. rate 18, SpO2 100.00%. Physical Exam  WDWN in NAD HEENT:  EOMI, sclera anicteric Neck:  No masses, no thyromegaly Lungs:  CTA bilaterally; normal respiratory effort CV:  Regular rate and rhythm; no murmurs Abd:  +bowel sounds, soft, non-tender, no masses GU:  Large right inguinal hernia extending down into scrotum - firm; tender Ext:  Well-perfused; no edema Skin:  Warm, dry; no sign of jaundice  Assessment/Plan Incarcerated right inguinal hernia.  Attempted reduction under conscious sedation - unsuccessful.  Will proceed directly to the OR for repair.   The surgical procedure has been discussed with the patient's family. Potential risks, benefits, alternative treatments, and expected outcomes have been explained.  All of the family's questions at this time have been answered.  They understand the possibility of bowel resection if the bowel appears compromised. The likelihood of reaching the patient's treatment goal is good.  The patient understand the proposed surgical procedure and wishes to proceed.   Edwin Cherian K. 03/17/2012, 8:38 PM

## 2012-03-17 NOTE — Op Note (Signed)
Hernia, Open, Procedure Note Partial omentectomy  Indications: The patient presented with a history of a right acutely incarcerated inguinal hernia.    Pre-operative Diagnosis: right incarcerated inguinal hernia Post-operative Diagnosis: same  Surgeon: Wynona Luna.   Assistants: none  Anesthesia: General endotracheal anesthesia  ASA Class: 1E  Procedure Details  The patient was seen again in the Holding Room. The risks, benefits, complications, treatment options, and expected outcomes were discussed with the patient. The possibilities of reaction to medication, pulmonary aspiration, perforation of viscus, bleeding, recurrent infection, the need for additional procedures, and development of a complication requiring transfusion or further operation were discussed with the patient and/or family. The likelihood of success in repairing the hernia and returning the patient to their previous functional status is good.  There was concurrence with the proposed plan, and informed consent was obtained. The site of surgery was properly noted/marked. The patient was taken to the Operating Room, identified as Orian Figueira, and the procedure verified as right inguinal hernia repair. A Time Out was held and the above information confirmed.  The patient was placed in the supine position and underwent induction of anesthesia. The lower abdomen and groin was prepped with Chloraprep, the scrotum and penis were prepped with Betadine,and draped in the standard fashion. An oblique incision was made above the inguinal ligament. Dissection was carried down through the subcutaneous tissue with cautery to the external oblique fascia.  We opened the external oblique fascia along the direction of its fibers to the external ring. The large hernia sac appeared dark purple.  I bluntly dissected down into the scrotum and delivered the hernia sac and testicle up into the field.  The sac was carefully opened and we  encountered a large amount of omentum.  The omentum had several thrombosed vessels.  The hernia also contained a 10 cm length of small bowel.  Initially, this appeared dark purple.  I was able to manually open the internal inguinal ring, and the bowel began to appear more pink.  There was no sign of perforation.  Peristalsis was observed in this segment of bowel.  The bowel was reduced into the peritoneal cavity.  The omentum was resected with the Ligasure device.  The hernia sac was dissected free from the spermatic cord and ligated at its neck with 2-0 silk.  The excess hernia sac was amputated and sent for pathologic examination.   The testicle was properly oriented and placed back into the scrotum.  The spermatic cord was retracted with a Penrose drain.  The floor of the inguinal canal was inspected and was intact.  We used a 3 x 6 inch piece of Ultrapro mesh, which was cut into a keyhole shape.  This was secured with 2-0 Prolene, beginning at the pubic tubercle, running this along the internal oblique fascia superiorly and the shelving edge inferiorly.  The tails of the mesh were sutured together behind the spermatic cord.  The mesh was tucked underneath the external oblique fascia laterally.  The external oblique fascia was reapproximated with 2-0 Vicryl.  3-0 Vicryl was used to close the subcutaneous tissues and 4-0 Monocryl was used to close the skin in subcuticular fashion.  Benzoin and steri-strips were used to seal the incision.  A clean dressing was applied.  The patient was then extubated and brought to the recovery room in stable condition.  All sponge, instrument, and needle counts were correct prior to closure and at the conclusion of the case.   Estimated Blood Loss:  Minimal                 Complications: None; patient tolerated the procedure well.         Disposition: PACU - hemodynamically stable.         Condition: stable  Wilmon Arms. Corliss Skains, MD, [facility]  [facility]  03/17/2012 11:34 PM

## 2012-03-17 NOTE — ED Notes (Signed)
Bedside report received from previous RN 

## 2012-03-17 NOTE — ED Notes (Signed)
Bed:WA13<BR> Expected date:<BR> Expected time:<BR> Means of arrival:<BR> Comments:<BR> Hold for triage 1

## 2012-03-17 NOTE — Transfer of Care (Signed)
Immediate Anesthesia Transfer of Care Note  Patient: Richard Guerra  Procedure(s) Performed: Procedure(s) (LRB) with comments: HERNIA REPAIR INGUINAL ADULT (Right) - incarcerated,PARTIAL OMENTECTOMY INSERTION OF MESH ()  Patient Location: PACU  Anesthesia Type:General  Level of Consciousness: awake, alert , oriented, patient cooperative and responds to stimulation  Airway & Oxygen Therapy: Patient Spontanous Breathing and Patient connected to face mask oxygen  Post-op Assessment: Report given to PACU RN, Post -op Vital signs reviewed and stable and Patient moving all extremities X 4  Post vital signs: Reviewed and stable  Complications: No apparent anesthesia complications

## 2012-03-17 NOTE — Preoperative (Signed)
Beta Blockers   Reason not to administer Beta Blockers:Not Applicable 

## 2012-03-18 ENCOUNTER — Encounter (HOSPITAL_COMMUNITY): Payer: Self-pay | Admitting: *Deleted

## 2012-03-18 MED ORDER — OXYCODONE-ACETAMINOPHEN 5-325 MG PO TABS
1.0000 | ORAL_TABLET | ORAL | Status: DC | PRN
  Administered 2012-03-18: 1 via ORAL
  Filled 2012-03-18: qty 1

## 2012-03-18 MED ORDER — INFLUENZA VIRUS VACC SPLIT PF IM SUSP
0.5000 mL | Freq: Once | INTRAMUSCULAR | Status: DC
  Filled 2012-03-18 (×2): qty 0.5

## 2012-03-18 MED ORDER — HYDROMORPHONE HCL PF 1 MG/ML IJ SOLN
INTRAMUSCULAR | Status: AC
  Administered 2012-03-18: 0.5 mg via INTRAVENOUS
  Filled 2012-03-18: qty 1

## 2012-03-18 MED ORDER — OXYCODONE-ACETAMINOPHEN 5-325 MG PO TABS: 1.0000 | ORAL_TABLET | ORAL | Status: DC | PRN

## 2012-03-18 NOTE — Progress Notes (Signed)
Assessment unchanged. Pt verbalized understanding of dc instructions. Po analgesics effectively managed pain this am prior to dc. Script x1 given as provided by physician. Discharged via wc to front entrance to meet awaiting vehicle to carry home. Accompanied by NT and family member.

## 2012-03-18 NOTE — Anesthesia Postprocedure Evaluation (Signed)
Anesthesia Post Note  Patient: Richard Guerra  Procedure(s) Performed: Procedure(s) (LRB): HERNIA REPAIR INGUINAL ADULT (Right) INSERTION OF MESH ()  Anesthesia type: General  Patient location: PACU  Post pain: Pain level controlled  Post assessment: Post-op Vital signs reviewed  Last Vitals: BP 126/65  Pulse 50  Temp 36.6 C (Oral)  Resp 12  SpO2 100%  Post vital signs: Reviewed  Level of consciousness: sedated  Complications: No apparent anesthesia complications

## 2012-03-18 NOTE — Discharge Summary (Signed)
Physician Discharge Summary  Patient ID: Richard Guerra MRN: [id number] DOB/AGE: Jun 29, 1990 21 y.o.  Admit date: 03/17/2012 Discharge date: 03/18/2012  Admission Diagnoses:  Incarcerated right inguinal hernia  Discharge Diagnoses: Incarcerated right inguinal hernia Active Problems:  * No active hospital problems. *    Discharged Condition: good  Hospital Course: Seen in ED with acutely incarcerated right inguinal hernia containing bowel.  Brought to the OR emergently.  Partial omentectomy and reduction of the small bowel, mesh hernia repair.  Patient doing better.  Having significant soreness in his right groin, as expected.  Consults: None  Significant Diagnostic Studies: CT scan - incarcerated small bowel in right inguinal hernia  Treatments: surgery: see above  Discharge Exam: Blood pressure 98/62, pulse 80, temperature 98.2 F (36.8 C), temperature source Oral, resp. rate 16, height 5\' 6"  (1.676 m), weight 149 lb (67.586 kg), SpO2 98.00%. Some local anesthetic has soaked into the gauze - no hematoma noted  Disposition: 01-Home or Self Care  Discharge Orders    Future Orders Please Complete By Expires   Diet general      Increase activity slowly      May walk up steps      May shower / Bathe      Driving Restrictions      Comments:   Do not drive while taking pain medications   Call MD for:  temperature >100.4      Call MD for:  persistant nausea and vomiting      Call MD for:  severe uncontrolled pain      Call MD for:  redness, tenderness, or signs of infection (pain, swelling, redness, odor or green/yellow discharge around incision site)      Discharge instructions      Comments:   [facility]   INGUINAL HERNIA REPAIR: POST OP INSTRUCTIONS  Always review your discharge instruction sheet given to you by the facility where your surgery was performed. IF YOU HAVE DISABILITY OR FAMILY LEAVE FORMS, YOU MUST BRING THEM TO THE OFFICE FOR PROCESSING.    DO NOT GIVE THEM TO YOUR DOCTOR.  A  prescription for pain medication may be given to you upon discharge.  Take your pain medication as prescribed, if needed.  If narcotic pain medicine is not needed, then you may take acetaminophen (Tylenol) or ibuprofen (Advil) as needed. Take your usually prescribed medications unless otherwise directed. If you need a refill on your pain medication, please contact your pharmacy.  They will contact our office to request authorization. Prescriptions will not be filled after 5 pm or on week-ends. You should follow a light diet the first 24 hours after arrival home, such as soup and crackers, etc.  Be sure to include lots of fluids daily.  Resume your normal diet the day after surgery. Most patients will experience some swelling and bruising around the umbilicus or in the groin and scrotum.  Ice packs and reclining will help.  Swelling and bruising can take several days to resolve.  It is common to experience some constipation if taking pain medication after surgery.  Increasing fluid intake and taking a stool softener (such as Colace) will usually help or prevent this problem from occurring.  A mild laxative (Milk of Magnesia or Miralax) should be taken according to package directions if there are no bowel movements after 48 hours. Unless discharge instructions indicate otherwise, you may remove your bandages 24-48 hours after surgery, and you may shower at that time.  You will  have steri-strips (small skin tapes) in place directly over the incision.  These strips should be left on the skin for 7-10 days. ACTIVITIES:  You may resume regular (light) daily activities beginning the next day-such as daily self-care, walking, climbing stairs-gradually increasing activities as tolerated.  You may have sexual intercourse when it is comfortable.  Refrain from any heavy lifting or straining until approved by your doctor. You may drive when you are no longer taking prescription  pain medication, you can comfortably wear a seatbelt, and you can safely maneuver your car and apply brakes. RETURN TO WORK:  2-3 weeks with light duty - no lifting over 15 lbs. You should see your doctor in the office for a follow-up appointment approximately 2-3 weeks after your surgery.  Make sure that you call for this appointment within a day or two after you arrive home to insure a convenient appointment time. OTHER INSTRUCTIONS:  __________________________________________________________________________________________________________________________________________________________________________________________  WHEN TO CALL YOUR DOCTOR: Fever over 101.0 Inability to urinate Nausea and/or vomiting Extreme swelling or bruising Continued bleeding from incision. Increased pain, redness, or drainage from the incision  The clinic staff is available to answer your questions during regular business hours.  Please don't hesitate to call and ask to speak to one of the nurses for clinical concerns.  If you have a medical emergency, go to the nearest emergency room or call 911.  A surgeon from [facility] is always on call at the hospital   [address], [address], [place], [place]  [address] ?  [address] [address], [place], [place]   [address] [phone]    FAX [phone] Web site: [email or web address]    Wear compression shorts or jockstrap to limit swelling of the scrotum.   Medication List     As of 03/18/2012  8:15 AM    TAKE these medications         acetaminophen 500 MG tablet   Commonly known as: TYLENOL   Take 1,000 mg by mouth every 6 (six) hours as needed. For pain      oxyCODONE-acetaminophen 5-325 MG per tablet   Commonly known as: PERCOCET/ROXICET   Take 1 tablet by mouth every 4 (four) hours as needed for pain.           Follow-up Information    Follow up with Richard Franta K., MD. Schedule an appointment as soon as  possible for a visit in 3 weeks.   Contact information:   [address] [place] [address] [phone]          Signed: Wynona Luna. 03/18/2012, 8:15 AM

## 2012-03-19 ENCOUNTER — Telehealth (INDEPENDENT_AMBULATORY_CARE_PROVIDER_SITE_OTHER): Payer: Self-pay | Admitting: General Surgery

## 2012-03-19 ENCOUNTER — Encounter (HOSPITAL_COMMUNITY): Payer: Self-pay | Admitting: Surgery

## 2012-03-19 NOTE — Telephone Encounter (Signed)
Pt called with concerns about swelling post op IHR.  His penis is swelling.  Reassured pt that his penis and scrotum will both swell and possible become discolored as well.  When sitting, advised him to elevate the scrotum with a rolled towel to facilitate drainage of the excess fluids back into his abdomen.  Continues to use ice pack for swelling as well.

## 2012-04-01 MED ORDER — POTASSIUM CHLORIDE CRYS ER 20 MEQ PO TBCR
EXTENDED_RELEASE_TABLET | ORAL | Status: AC
  Filled 2012-04-01: qty 1

## 2015-08-18 DIAGNOSIS — H6983 Other specified disorders of Eustachian tube, bilateral: Secondary | ICD-10-CM | POA: Diagnosis not present

## 2015-09-24 DIAGNOSIS — S058X2A Other injuries of left eye and orbit, initial encounter: Secondary | ICD-10-CM | POA: Diagnosis not present

## 2015-09-24 DIAGNOSIS — H43812 Vitreous degeneration, left eye: Secondary | ICD-10-CM | POA: Diagnosis not present

## 2015-09-24 DIAGNOSIS — H3562 Retinal hemorrhage, left eye: Secondary | ICD-10-CM | POA: Diagnosis not present

## 2015-09-24 DIAGNOSIS — H2000 Unspecified acute and subacute iridocyclitis: Secondary | ICD-10-CM | POA: Diagnosis not present

## 2015-11-05 DIAGNOSIS — S61412A Laceration without foreign body of left hand, initial encounter: Secondary | ICD-10-CM | POA: Diagnosis not present

## 2015-11-05 DIAGNOSIS — M79642 Pain in left hand: Secondary | ICD-10-CM | POA: Diagnosis not present

## 2015-11-12 DIAGNOSIS — S61412D Laceration without foreign body of left hand, subsequent encounter: Secondary | ICD-10-CM | POA: Diagnosis not present

## 2015-11-12 DIAGNOSIS — M79642 Pain in left hand: Secondary | ICD-10-CM | POA: Diagnosis not present

## 2016-05-06 ENCOUNTER — Encounter (HOSPITAL_COMMUNITY): Payer: Self-pay | Admitting: *Deleted

## 2016-05-06 ENCOUNTER — Emergency Department (HOSPITAL_COMMUNITY)
Admission: EM | Admit: 2016-05-06 | Discharge: 2016-05-06 | Disposition: A | Discharge status: Home / Self Care | Payer: BLUE CROSS/BLUE SHIELD | Attending: Emergency Medicine | Admitting: Emergency Medicine

## 2016-05-06 DIAGNOSIS — R112 Nausea with vomiting, unspecified: Secondary | ICD-10-CM | POA: Diagnosis not present

## 2016-05-06 DIAGNOSIS — J029 Acute pharyngitis, unspecified: Secondary | ICD-10-CM | POA: Diagnosis not present

## 2016-05-06 DIAGNOSIS — Z79899 Other long term (current) drug therapy: Secondary | ICD-10-CM | POA: Diagnosis not present

## 2016-05-06 LAB — COMPREHENSIVE METABOLIC PANEL
ALBUMIN: 5 g/dL (ref 3.5–5.0)
ALT: 29 U/L (ref 17–63)
ANION GAP: 8 (ref 5–15)
AST: 57 U/L — ABNORMAL HIGH (ref 15–41)
Alkaline Phosphatase: 85 U/L (ref 38–126)
BUN: 18 mg/dL (ref 6–20)
CHLORIDE: 105 mmol/L (ref 101–111)
CO2: 28 mmol/L (ref 22–32)
Calcium: 9.3 mg/dL (ref 8.9–10.3)
Creatinine, Ser: 1.1 mg/dL (ref 0.61–1.24)
GFR calc Af Amer: >60 mL/min (ref >60)
GFR calc non Af Amer: >60 mL/min (ref >60)
GLUCOSE: 121 mg/dL — AB (ref 65–99)
POTASSIUM: 4 mmol/L (ref 3.5–5.1)
SODIUM: 141 mmol/L (ref 135–145)
TOTAL PROTEIN: 8.2 g/dL — AB (ref 6.5–8.1)
Total Bilirubin: 0.3 mg/dL (ref 0.3–1.2)

## 2016-05-06 LAB — CBC WITH DIFFERENTIAL/PLATELET
BASOS ABS: 0 10*3/uL (ref 0.0–0.1)
Basophils Relative: 0 %
EOS ABS: 0 10*3/uL (ref 0.0–0.7)
Eosinophils Relative: 0 %
HCT: 48.9 % (ref 39.0–52.0)
Hemoglobin: 16.5 g/dL (ref 13.0–17.0)
LYMPHS ABS: 0.8 10*3/uL (ref 0.7–4.0)
Lymphocytes Relative: 5 %
MCH: 22.3 pg — ABNORMAL LOW (ref 26.0–34.0)
MCHC: 33.7 g/dL (ref 30.0–36.0)
MCV: 66.1 fL — AB (ref 78.0–100.0)
MONO ABS: 0.5 10*3/uL (ref 0.1–1.0)
Monocytes Relative: 3 %
NEUTROS PCT: 92 %
Neutro Abs: 14.1 10*3/uL — ABNORMAL HIGH (ref 1.7–7.7)
PLATELETS: 222 10*3/uL (ref 150–400)
RBC: 7.4 MIL/uL — AB (ref 4.22–5.81)
RDW: 14.1 % (ref 11.5–15.5)
WBC: 15.4 10*3/uL — ABNORMAL HIGH (ref 4.0–10.5)

## 2016-05-06 LAB — LIPASE, BLOOD: Lipase: 34 U/L (ref 11–51)

## 2016-05-06 LAB — I-STAT CG4 LACTIC ACID, ED: Lactic Acid, Venous: 1.66 mmol/L (ref 0.5–1.9)

## 2016-05-06 LAB — RAPID URINE DRUG SCREEN, HOSP PERFORMED
Amphetamines: NOT DETECTED
BENZODIAZEPINES: NOT DETECTED
Barbiturates: NOT DETECTED
Cocaine: NOT DETECTED
Opiates: NOT DETECTED
Tetrahydrocannabinol: POSITIVE — AB

## 2016-05-06 LAB — URINALYSIS, ROUTINE W REFLEX MICROSCOPIC
Bilirubin Urine: NEGATIVE
Glucose, UA: NEGATIVE mg/dL
Hgb urine dipstick: NEGATIVE
KETONES UR: 5 mg/dL — AB
LEUKOCYTES UA: NEGATIVE
NITRITE: NEGATIVE
PH: 7 (ref 5.0–8.0)
PROTEIN: NEGATIVE mg/dL
Specific Gravity, Urine: 1.024 (ref 1.005–1.030)

## 2016-05-06 LAB — ACETAMINOPHEN LEVEL

## 2016-05-06 LAB — SALICYLATE LEVEL

## 2016-05-06 LAB — ETHANOL: Alcohol, Ethyl (B): <5 mg/dL (ref <5)

## 2016-05-06 MED ORDER — ONDANSETRON 4 MG PO TBDP: 4.0000 mg | ORAL_TABLET | Freq: Once | ORAL | Status: DC

## 2016-05-06 MED ORDER — ONDANSETRON 4 MG PO TBDP: ORAL_TABLET | ORAL | 0 refills | Status: DC

## 2016-05-06 MED ORDER — FAMOTIDINE IN NACL 20-0.9 MG/50ML-% IV SOLN
20.0000 mg | Freq: Once | INTRAVENOUS | Status: AC
  Administered 2016-05-06: 20 mg via INTRAVENOUS
  Filled 2016-05-06: qty 50

## 2016-05-06 MED ORDER — SODIUM CHLORIDE 0.9 % IV BOLUS (SEPSIS)
1000.0000 mL | Freq: Once | INTRAVENOUS | Status: AC
  Administered 2016-05-06: 1000 mL via INTRAVENOUS

## 2016-05-06 MED ORDER — ONDANSETRON HCL 4 MG/2ML IJ SOLN
4.0000 mg | Freq: Once | INTRAMUSCULAR | Status: AC
  Administered 2016-05-06: 4 mg via INTRAVENOUS
  Filled 2016-05-06: qty 2

## 2016-05-06 MED ORDER — PROMETHAZINE HCL 25 MG/ML IJ SOLN
25.0000 mg | Freq: Once | INTRAMUSCULAR | Status: AC
  Administered 2016-05-06: 25 mg via INTRAVENOUS
  Filled 2016-05-06: qty 1

## 2016-05-06 MED ORDER — SODIUM CHLORIDE 0.9 % IV BOLUS (SEPSIS)
2000.0000 mL | Freq: Once | INTRAVENOUS | Status: AC
  Administered 2016-05-06: 2000 mL via INTRAVENOUS

## 2016-05-06 MED ORDER — PROMETHAZINE HCL 25 MG PO TABS: 25.0000 mg | ORAL_TABLET | Freq: Four times a day (QID) | ORAL | 0 refills | Status: DC | PRN

## 2016-05-06 NOTE — ED Triage Notes (Addendum)
Pt seen at urgent care this morning, given Z pack for ? Strep. Pt started meds, at sandwich, now is difficulty to arouse. Took 2 day quill earlier this morning. Now reports N/V and chills

## 2016-05-06 NOTE — ED Notes (Signed)
PT aware of urine sample. Urinal in hand  

## 2016-05-06 NOTE — ED Notes (Signed)
Dad info this Clinical research associatewriter PT visit could be related to med or chicken sandwich from fast [employer]

## 2016-05-06 NOTE — ED Notes (Signed)
Pt's family reports pt started to take first dose of Z-pak rx by his PCP for strep throat.  States 30 minutes after taking it, he started to c/o not feeling good and have n/v.  Upon assessment, pallor noted, pt appears lethargic but responds to voice.  Chills noted after taking his shirt to put gown on.  Pt is afebrile.

## 2016-05-06 NOTE — ED Provider Notes (Signed)
[facility]-EMERGENCY DEPT Provider Note   CSN: [id number] Arrival date & time: 05/06/16  1612     History   Chief Complaint Chief Complaint  Patient presents with  . Allergic Reaction      HPI   Blood pressure 150/94, pulse 85, temperature 98.1 F (36.7 C), temperature source Oral, resp. rate 18, height 5\' 5"  (1.651 m), weight 70.3 kg, SpO2 100 %.  Richard Guerra is a 26 y.o. male with no significant past medical history accompanied by grandfather complaining of acute onset of multiple episodes of nonbloody, nonbilious, non-coffee ground emesis starting this morning after he take his first dose of azithromycin which she is was taking for a pharyngitis. He also just 8 check for leg growth sandwich before the emesis. He denies fever, sick contacts, diarrhea. He endorses a moderate epigastric abdominal pain onset after the emesis. He states that he's had a sore throat over the weekend, he saw his primary care Dr. Deboraha SprangEagle, rapid strep was negative but they started him on azithromycin because the physical exam was concerning for bacterial pharyngitis. Patient denies fever, cough, otalgia, chest pain, shortness of breath, rash, myalgia, headache, cervicalgia, recent travel, wheezing, lip or tongue swelling, previous allergic reaction. As per grandfather he thinks that patient has had azithromycin in the past.  Past Medical History:  Diagnosis Date  . Concussion    at age 26 due to fall while playing football  . Febrile seizure (HCC)    at age 28 months  . No pertinent past medical history     Patient Active Problem List   Diagnosis Date Noted  . Blunt flank trauma 09/26/2011  . Liver laceration/contusion, grade I 09/26/2011  . Acute blood loss anemia 09/26/2011  . Renal hemorrhage, right-traumatic 09/21/2011    Past Surgical History:  Procedure Laterality Date  . INGUINAL HERNIA REPAIR  03/17/2012   Procedure: HERNIA REPAIR INGUINAL ADULT;  Surgeon: Wilmon ArmsMatthew K. Corliss Skainssuei, MD;  Location: [facility]  ORS;  Service: General;  Laterality: Right;  incarcerated,PARTIAL OMENTECTOMY  . INNER EAR SURGERY     tubes in both ears approx. 4018 months old  . INSERTION OF MESH  03/17/2012   Procedure: INSERTION OF MESH;  Surgeon: Wilmon ArmsMatthew K. Corliss Skainssuei, MD;  Location: [facility] ORS;  Service: General;;       Home Medications    Prior to Admission medications   Medication Sig Start Date End Date Taking? Authorizing Provider  acetaminophen (TYLENOL) 500 MG tablet Take 500 mg by mouth every 6 (six) hours as needed. For pain    Yes Historical Provider, MD  Pseudoephedrine-APAP-DM (DAYQUIL PO) Take 2 capsules by mouth every 6 (six) hours as needed (cold).   Yes Historical Provider, MD  ondansetron (ZOFRAN ODT) 4 MG disintegrating tablet 4mg  ODT q4 hours prn nausea/vomit 05/06/16   Zabella Wease, PA-C  promethazine (PHENERGAN) 25 MG tablet Take 1 tablet (25 mg total) by mouth every 6 (six) hours as needed for nausea or vomiting. 05/06/16   Joni ReiningNicole Cassadie Pankonin, PA-C    Family History No family history on file.  Social History Social History  Substance Use Topics  . Smoking status: Never Smoker  . Smokeless tobacco: Never Used  . Alcohol use Yes     Comment: social     Allergies   Azithromycin   Review of Systems Review of Systems  10 systems reviewed and found to be negative, except as noted in the HPI.  Physical Exam Updated Vital Signs BP 132/62   Pulse 69   Temp  98.9 F (37.2 C) (Oral)   Resp 21   Ht 5\' 5"  (1.651 m)   Wt 70.3 kg   SpO2 100%   BMI 25.79 kg/m   Physical Exam  Constitutional: He is oriented to person, place, and time. He appears well-developed and well-nourished. No distress.  Slightly somnolent, appears acutely ill, arousable to voice  HENT:  Head: Normocephalic and atraumatic.  Mouth/Throat: Oropharynx is clear and moist.  Eyes: Conjunctivae and EOM are normal. Pupils are equal, round, and reactive to light.  Neck: Normal range of motion.  Cardiovascular: Normal rate,  regular rhythm and intact distal pulses.   Pulmonary/Chest: Effort normal and breath sounds normal. No respiratory distress. He has no wheezes.  Abdominal: Soft. He exhibits no distension and no mass. There is tenderness. There is no rebound and no guarding. No hernia.  Hyperactive bowel sounds, tenderness palpation epigastrium with no guarding or rebound.  Musculoskeletal: Normal range of motion.  Neurological: He is alert and oriented to person, place, and time.  Skin: He is not diaphoretic.  Psychiatric: He has a normal mood and affect.  Nursing note and vitals reviewed.    ED Treatments / Results  Labs (all labs ordered are listed, but only abnormal results are displayed) Labs Reviewed  CBC WITH DIFFERENTIAL/PLATELET - Abnormal; Notable for the following:       Result Value   WBC 15.4 (*)    RBC 7.40 (*)    MCV 66.1 (*)    MCH 22.3 (*)    Neutro Abs 14.1 (*)    All other components within normal limits  ACETAMINOPHEN LEVEL - Abnormal; Notable for the following:    Acetaminophen (Tylenol), Serum <10 (*)    All other components within normal limits  COMPREHENSIVE METABOLIC PANEL - Abnormal; Notable for the following:    Glucose, Bld 121 (*)    Total Protein 8.2 (*)    AST 57 (*)    All other components within normal limits  URINALYSIS, ROUTINE W REFLEX MICROSCOPIC - Abnormal; Notable for the following:    Ketones, ur 5 (*)    All other components within normal limits  RAPID URINE DRUG SCREEN, HOSP PERFORMED - Abnormal; Notable for the following:    Tetrahydrocannabinol POSITIVE (*)    All other components within normal limits  GASTROINTESTINAL PANEL BY PCR, STOOL (REPLACES STOOL CULTURE)  LIPASE, BLOOD  SALICYLATE LEVEL  ETHANOL  I-STAT CG4 LACTIC ACID, ED    EKG  EKG Interpretation None       Radiology No results found.  Procedures Procedures (including critical care time)  Medications Ordered in ED Medications  sodium chloride 0.9 % bolus 1,000 mL (0  mLs Intravenous Stopped 05/06/16 1856)  ondansetron (ZOFRAN) injection 4 mg (4 mg Intravenous Given 05/06/16 1717)  promethazine (PHENERGAN) injection 25 mg (25 mg Intravenous Given 05/06/16 1912)  famotidine (PEPCID) IVPB 20 mg premix (0 mg Intravenous Stopped 05/06/16 1934)  sodium chloride 0.9 % bolus 2,000 mL (0 mLs Intravenous Stopped 05/06/16 2136)     Initial Impression / Assessment and Plan / ED Course  I have reviewed the triage vital signs and the nursing notes.  Pertinent labs & imaging results that were available during my care of the patient were reviewed by me and considered in my medical decision making (see chart for details).     Vitals:   05/06/16 2000 05/06/16 2100 05/06/16 2130 05/06/16 2141  BP: 135/86 128/68 132/62   Pulse: 80 75 69   Resp: 17  18 21   Temp:    98.9 F (37.2 C)  TempSrc:    Oral  SpO2: 100% 99% 100%   Weight:      Height:        Medications  sodium chloride 0.9 % bolus 1,000 mL (0 mLs Intravenous Stopped 05/06/16 1856)  ondansetron (ZOFRAN) injection 4 mg (4 mg Intravenous Given 05/06/16 1717)  promethazine (PHENERGAN) injection 25 mg (25 mg Intravenous Given 05/06/16 1912)  famotidine (PEPCID) IVPB 20 mg premix (0 mg Intravenous Stopped 05/06/16 1934)  sodium chloride 0.9 % bolus 2,000 mL (0 mLs Intravenous Stopped 05/06/16 2136)    Richard Guerra is 26 y.o. male presenting with Acute onset of multiple episodes of nonbloody, nonbilious, no coffee-ground emesis. This occurred directly after taking azithromycin for a sore throat. No hives, wheezing, lip or tongue swelling consistent with an anaphylactic reaction. Abdominal exam is benign. This does not appear to be a flulike illness, there is no rhinorrhea, myalgia, headache. He did have a sore throat and was seen by his primary care physician for that, rapid strep was negative and he was started on Z-Pak. He also just a dull Chick fillet sandwich before the vomiting. Possible food poisoning is  considered. He has no associated diarrhea however will put him in for a GI pathogen by PCR for able to obtain a stool sample.  Repeat abdominal exam remains nonsurgical. Patient was slightly somnolent initially so extensive workup including EtOH, salicylate and acetaminophen level are negative. He has a normal anion gap, blood work is in general quite reassuring. He continues to vomit, will give 2 more liters and Phenergan and recheck.  Patient tolerating by mouth's, repeat abdominal exam is benign. Urine drug screen positive for THC. This may be the cause of this patient's somnolence. Possible that this could also be cannabinoid hyperemesis syndrome. Out of an abundance of caution I have advised him to DC the azithromycin, and also listed this as an allergy. Advised him to follow closely with PCP.  Evaluation does not show pathology that would require ongoing emergent intervention or inpatient treatment. Pt is hemodynamically stable and mentating appropriately. Discussed findings and plan with patient/guardian, who agrees with care plan. All questions answered. Return precautions discussed and outpatient follow up given.      Final Clinical Impressions(s) / ED Diagnoses   Final diagnoses:  Nausea and vomiting, intractability of vomiting not specified, unspecified vomiting type    New Prescriptions Discharge Medication List as of 05/06/2016  9:30 PM    START taking these medications   Details  ondansetron (ZOFRAN ODT) 4 MG disintegrating tablet 4mg  ODT q4 hours prn nausea/vomit, Print    promethazine (PHENERGAN) 25 MG tablet Take 1 tablet (25 mg total) by mouth every 6 (six) hours as needed for nausea or vomiting., Starting Fri 05/06/2016, Print         Abbagale Goguen, PA-C 05/06/16 2211    Nira Conn, MD 05/07/16 2245

## 2016-05-06 NOTE — Discharge Instructions (Addendum)

## 2016-05-09 DIAGNOSIS — R112 Nausea with vomiting, unspecified: Secondary | ICD-10-CM | POA: Diagnosis not present

## 2016-07-20 DIAGNOSIS — R1031 Right lower quadrant pain: Secondary | ICD-10-CM | POA: Diagnosis not present

## 2016-07-21 DIAGNOSIS — R310 Gross hematuria: Secondary | ICD-10-CM | POA: Diagnosis not present

## 2016-07-25 DIAGNOSIS — R310 Gross hematuria: Secondary | ICD-10-CM | POA: Diagnosis not present

## 2016-07-25 DIAGNOSIS — N200 Calculus of kidney: Secondary | ICD-10-CM | POA: Diagnosis not present

## 2016-08-23 DIAGNOSIS — H02051 Trichiasis without entropian right upper eyelid: Secondary | ICD-10-CM | POA: Diagnosis not present

## 2016-12-21 DIAGNOSIS — H5213 Myopia, bilateral: Secondary | ICD-10-CM | POA: Diagnosis not present

## 2017-09-08 DIAGNOSIS — F900 Attention-deficit hyperactivity disorder, predominantly inattentive type: Secondary | ICD-10-CM | POA: Diagnosis not present

## 2017-11-20 DIAGNOSIS — M25521 Pain in right elbow: Secondary | ICD-10-CM | POA: Diagnosis not present

## 2017-12-28 DIAGNOSIS — L237 Allergic contact dermatitis due to plants, except food: Secondary | ICD-10-CM | POA: Diagnosis not present

## 2017-12-28 DIAGNOSIS — Z23 Encounter for immunization: Secondary | ICD-10-CM | POA: Diagnosis not present

## 2018-08-24 DIAGNOSIS — H16041 Marginal corneal ulcer, right eye: Secondary | ICD-10-CM | POA: Diagnosis not present

## 2018-08-30 DIAGNOSIS — H16041 Marginal corneal ulcer, right eye: Secondary | ICD-10-CM | POA: Diagnosis not present

## 2018-09-28 DIAGNOSIS — H1789 Other corneal scars and opacities: Secondary | ICD-10-CM | POA: Diagnosis not present

## 2018-10-08 DIAGNOSIS — H5213 Myopia, bilateral: Secondary | ICD-10-CM | POA: Diagnosis not present

## 2018-10-08 DIAGNOSIS — H1789 Other corneal scars and opacities: Secondary | ICD-10-CM | POA: Diagnosis not present

## 2018-10-08 DIAGNOSIS — H52203 Unspecified astigmatism, bilateral: Secondary | ICD-10-CM | POA: Diagnosis not present

## 2019-01-30 DIAGNOSIS — R112 Nausea with vomiting, unspecified: Secondary | ICD-10-CM | POA: Diagnosis not present

## 2019-02-01 DIAGNOSIS — R112 Nausea with vomiting, unspecified: Secondary | ICD-10-CM | POA: Diagnosis not present

## 2019-02-27 DIAGNOSIS — R112 Nausea with vomiting, unspecified: Secondary | ICD-10-CM | POA: Diagnosis not present

## 2019-03-01 DIAGNOSIS — R112 Nausea with vomiting, unspecified: Secondary | ICD-10-CM | POA: Diagnosis not present

## 2019-03-20 DIAGNOSIS — Z131 Encounter for screening for diabetes mellitus: Secondary | ICD-10-CM | POA: Diagnosis not present

## 2019-03-20 DIAGNOSIS — R112 Nausea with vomiting, unspecified: Secondary | ICD-10-CM | POA: Diagnosis not present

## 2020-07-01 DIAGNOSIS — H5213 Myopia, bilateral: Secondary | ICD-10-CM | POA: Diagnosis not present

## 2020-07-01 DIAGNOSIS — H52203 Unspecified astigmatism, bilateral: Secondary | ICD-10-CM | POA: Diagnosis not present

## 2020-07-01 DIAGNOSIS — H10413 Chronic giant papillary conjunctivitis, bilateral: Secondary | ICD-10-CM | POA: Diagnosis not present

## 2020-08-18 ENCOUNTER — Encounter (HOSPITAL_COMMUNITY): Payer: Self-pay | Admitting: *Deleted

## 2020-08-18 ENCOUNTER — Emergency Department (HOSPITAL_COMMUNITY): Payer: BC Managed Care – PPO

## 2020-08-18 ENCOUNTER — Emergency Department (HOSPITAL_COMMUNITY): Payer: BC Managed Care – PPO | Admitting: Anesthesiology

## 2020-08-18 ENCOUNTER — Encounter (HOSPITAL_COMMUNITY)
Admission: EM | Disposition: A | Discharge status: Home / Self Care | Payer: Self-pay | Source: Home / Self Care | Attending: Orthopedic Surgery

## 2020-08-18 ENCOUNTER — Inpatient Hospital Stay (HOSPITAL_COMMUNITY)
Admission: EM | Admit: 2020-08-18 | Discharge: 2020-08-20 | DRG: 577 | Disposition: A | Discharge status: Home / Self Care | Payer: BC Managed Care – PPO | Attending: Orthopedic Surgery | Admitting: Orthopedic Surgery

## 2020-08-18 ENCOUNTER — Other Ambulatory Visit: Payer: Self-pay

## 2020-08-18 DIAGNOSIS — S63293A Dislocation of distal interphalangeal joint of left middle finger, initial encounter: Secondary | ICD-10-CM | POA: Diagnosis not present

## 2020-08-18 DIAGNOSIS — S61252A Open bite of right middle finger without damage to nail, initial encounter: Secondary | ICD-10-CM | POA: Diagnosis not present

## 2020-08-18 DIAGNOSIS — S61230A Puncture wound without foreign body of right index finger without damage to nail, initial encounter: Secondary | ICD-10-CM | POA: Diagnosis present

## 2020-08-18 DIAGNOSIS — S61451A Open bite of right hand, initial encounter: Secondary | ICD-10-CM | POA: Diagnosis not present

## 2020-08-18 DIAGNOSIS — S64493A Injury of digital nerve of left middle finger, initial encounter: Secondary | ICD-10-CM | POA: Diagnosis not present

## 2020-08-18 DIAGNOSIS — S61231A Puncture wound without foreign body of left index finger without damage to nail, initial encounter: Secondary | ICD-10-CM | POA: Diagnosis present

## 2020-08-18 DIAGNOSIS — S51851A Open bite of right forearm, initial encounter: Secondary | ICD-10-CM | POA: Diagnosis not present

## 2020-08-18 DIAGNOSIS — W540XXA Bitten by dog, initial encounter: Secondary | ICD-10-CM | POA: Diagnosis present

## 2020-08-18 DIAGNOSIS — S51831A Puncture wound without foreign body of right forearm, initial encounter: Principal | ICD-10-CM | POA: Diagnosis present

## 2020-08-18 DIAGNOSIS — Z20822 Contact with and (suspected) exposure to covid-19: Secondary | ICD-10-CM | POA: Diagnosis present

## 2020-08-18 DIAGNOSIS — S62633A Displaced fracture of distal phalanx of left middle finger, initial encounter for closed fracture: Secondary | ICD-10-CM | POA: Diagnosis not present

## 2020-08-18 DIAGNOSIS — S66392A Other injury of extensor muscle, fascia and tendon of right middle finger at wrist and hand level, initial encounter: Secondary | ICD-10-CM | POA: Diagnosis not present

## 2020-08-18 DIAGNOSIS — S62633B Displaced fracture of distal phalanx of left middle finger, initial encounter for open fracture: Secondary | ICD-10-CM | POA: Diagnosis present

## 2020-08-18 DIAGNOSIS — S61431A Puncture wound without foreign body of right hand, initial encounter: Secondary | ICD-10-CM | POA: Diagnosis present

## 2020-08-18 DIAGNOSIS — S5011XA Contusion of right forearm, initial encounter: Secondary | ICD-10-CM | POA: Diagnosis not present

## 2020-08-18 DIAGNOSIS — S61234A Puncture wound without foreign body of right ring finger without damage to nail, initial encounter: Secondary | ICD-10-CM | POA: Diagnosis present

## 2020-08-18 DIAGNOSIS — S62603B Fracture of unspecified phalanx of left middle finger, initial encounter for open fracture: Secondary | ICD-10-CM | POA: Diagnosis not present

## 2020-08-18 DIAGNOSIS — Z23 Encounter for immunization: Secondary | ICD-10-CM

## 2020-08-18 DIAGNOSIS — Z881 Allergy status to other antibiotic agents status: Secondary | ICD-10-CM | POA: Diagnosis not present

## 2020-08-18 DIAGNOSIS — S61232A Puncture wound without foreign body of right middle finger without damage to nail, initial encounter: Secondary | ICD-10-CM | POA: Diagnosis not present

## 2020-08-18 DIAGNOSIS — D62 Acute posthemorrhagic anemia: Secondary | ICD-10-CM | POA: Diagnosis not present

## 2020-08-18 DIAGNOSIS — S64491A Injury of digital nerve of left index finger, initial encounter: Secondary | ICD-10-CM | POA: Diagnosis not present

## 2020-08-18 DIAGNOSIS — S61452A Open bite of left hand, initial encounter: Secondary | ICD-10-CM | POA: Diagnosis not present

## 2020-08-18 DIAGNOSIS — S61401A Unspecified open wound of right hand, initial encounter: Secondary | ICD-10-CM | POA: Diagnosis not present

## 2020-08-18 HISTORY — PX: I & D EXTREMITY: SHX5045

## 2020-08-18 HISTORY — PX: PERCUTANEOUS PINNING: SHX2209

## 2020-08-18 LAB — BASIC METABOLIC PANEL
Anion gap: 4 — ABNORMAL LOW (ref 5–15)
BUN: 11 mg/dL (ref 6–20)
CO2: 26 mmol/L (ref 22–32)
Calcium: 8.8 mg/dL — ABNORMAL LOW (ref 8.9–10.3)
Chloride: 107 mmol/L (ref 98–111)
Creatinine, Ser: 1.22 mg/dL (ref 0.61–1.24)
GFR, Estimated: >60 mL/min (ref >60)
Glucose, Bld: 114 mg/dL — ABNORMAL HIGH (ref 70–99)
Potassium: 3.8 mmol/L (ref 3.5–5.1)
Sodium: 137 mmol/L (ref 135–145)

## 2020-08-18 LAB — RESP PANEL BY RT-PCR (FLU A&B, COVID) ARPGX2
Influenza A by PCR: NEGATIVE
Influenza B by PCR: NEGATIVE
SARS Coronavirus 2 by RT PCR: NEGATIVE

## 2020-08-18 SURGERY — IRRIGATION AND DEBRIDEMENT EXTREMITY
Anesthesia: General | Site: Hand | Laterality: Left

## 2020-08-18 MED ORDER — DROPERIDOL 2.5 MG/ML IJ SOLN: 0.6250 mg | Freq: Once | INTRAMUSCULAR | Status: DC | PRN

## 2020-08-18 MED ORDER — ACETAMINOPHEN 325 MG PO TABS
325.0000 mg | ORAL_TABLET | Freq: Four times a day (QID) | ORAL | Status: DC | PRN
Start: 2020-08-19 — End: 2020-08-20

## 2020-08-18 MED ORDER — PROPOFOL 10 MG/ML IV BOLUS
INTRAVENOUS | Status: AC
  Filled 2020-08-18: qty 20

## 2020-08-18 MED ORDER — SODIUM CHLORIDE 0.9 % IR SOLN
Status: DC | PRN
  Administered 2020-08-18 (×2): 3000 mL
  Administered 2020-08-18: 1000 mL

## 2020-08-18 MED ORDER — MIDAZOLAM HCL 2 MG/2ML IJ SOLN
INTRAMUSCULAR | Status: AC
  Filled 2020-08-18: qty 2

## 2020-08-18 MED ORDER — OXYCODONE HCL 5 MG/5ML PO SOLN
5.0000 mg | Freq: Once | ORAL | Status: DC | PRN
Start: 2020-08-18 — End: 2020-08-18

## 2020-08-18 MED ORDER — METHOCARBAMOL 500 MG PO TABS
500.0000 mg | ORAL_TABLET | Freq: Four times a day (QID) | ORAL | Status: DC | PRN
  Administered 2020-08-19 – 2020-08-20 (×4): 500 mg via ORAL
  Filled 2020-08-18 (×5): qty 1

## 2020-08-18 MED ORDER — ROCURONIUM BROMIDE 10 MG/ML (PF) SYRINGE
PREFILLED_SYRINGE | INTRAVENOUS | Status: DC | PRN
  Administered 2020-08-18: 70 mg via INTRAVENOUS

## 2020-08-18 MED ORDER — MORPHINE SULFATE (PF) 4 MG/ML IV SOLN
4.0000 mg | Freq: Once | INTRAVENOUS | Status: AC
Start: 2020-08-18 — End: 2020-08-18
  Administered 2020-08-18: 4 mg via INTRAVENOUS
  Filled 2020-08-18: qty 1

## 2020-08-18 MED ORDER — METHOCARBAMOL 1000 MG/10ML IJ SOLN
500.0000 mg | Freq: Four times a day (QID) | INTRAMUSCULAR | Status: DC | PRN
  Filled 2020-08-18: qty 5

## 2020-08-18 MED ORDER — ONDANSETRON HCL 4 MG PO TABS: 4.0000 mg | ORAL_TABLET | Freq: Four times a day (QID) | ORAL | Status: DC | PRN

## 2020-08-18 MED ORDER — CHLORHEXIDINE GLUCONATE 0.12 % MT SOLN
15.0000 mL | Freq: Once | OROMUCOSAL | Status: AC
Start: 2020-08-18 — End: 2020-08-18

## 2020-08-18 MED ORDER — FENTANYL CITRATE (PF) 250 MCG/5ML IJ SOLN
INTRAMUSCULAR | Status: AC
  Filled 2020-08-18: qty 5

## 2020-08-18 MED ORDER — SODIUM CHLORIDE 0.9 % IV SOLN
3.0000 g | Freq: Once | INTRAVENOUS | Status: AC
  Administered 2020-08-18: 3 g via INTRAVENOUS
  Filled 2020-08-18: qty 3

## 2020-08-18 MED ORDER — HYDROMORPHONE HCL 1 MG/ML IJ SOLN
INTRAMUSCULAR | Status: AC
  Filled 2020-08-18: qty 1

## 2020-08-18 MED ORDER — MORPHINE SULFATE (PF) 4 MG/ML IV SOLN
4.0000 mg | Freq: Once | INTRAVENOUS | Status: AC
  Administered 2020-08-18: 4 mg via INTRAVENOUS
  Filled 2020-08-18: qty 1

## 2020-08-18 MED ORDER — SUGAMMADEX SODIUM 200 MG/2ML IV SOLN
INTRAVENOUS | Status: DC | PRN
  Administered 2020-08-18: 200 mg via INTRAVENOUS

## 2020-08-18 MED ORDER — ONDANSETRON HCL 4 MG/2ML IJ SOLN
INTRAMUSCULAR | Status: AC
  Filled 2020-08-18: qty 2

## 2020-08-18 MED ORDER — PROMETHAZINE HCL 25 MG/ML IJ SOLN: 6.2500 mg | INTRAMUSCULAR | Status: DC | PRN

## 2020-08-18 MED ORDER — DEXAMETHASONE SODIUM PHOSPHATE 10 MG/ML IJ SOLN
INTRAMUSCULAR | Status: AC
  Filled 2020-08-18: qty 1

## 2020-08-18 MED ORDER — ASCORBIC ACID 500 MG PO TABS
1000.0000 mg | ORAL_TABLET | Freq: Every day | ORAL | Status: DC
  Administered 2020-08-18 – 2020-08-20 (×3): 1000 mg via ORAL
  Filled 2020-08-18 (×3): qty 2

## 2020-08-18 MED ORDER — ACETAMINOPHEN 500 MG PO TABS
1000.0000 mg | ORAL_TABLET | Freq: Four times a day (QID) | ORAL | Status: AC
  Administered 2020-08-18 – 2020-08-19 (×3): 1000 mg via ORAL
  Filled 2020-08-18 (×3): qty 2

## 2020-08-18 MED ORDER — ONDANSETRON HCL 4 MG/2ML IJ SOLN
4.0000 mg | Freq: Four times a day (QID) | INTRAMUSCULAR | Status: DC | PRN
  Administered 2020-08-18: 4 mg via INTRAVENOUS
  Filled 2020-08-18: qty 2

## 2020-08-18 MED ORDER — LACTATED RINGERS IV BOLUS
1000.0000 mL | Freq: Once | INTRAVENOUS | Status: AC
  Administered 2020-08-18: 1000 mL via INTRAVENOUS

## 2020-08-18 MED ORDER — ORAL CARE MOUTH RINSE: 15.0000 mL | Freq: Once | OROMUCOSAL | Status: AC

## 2020-08-18 MED ORDER — TETANUS-DIPHTH-ACELL PERTUSSIS 5-2.5-18.5 LF-MCG/0.5 IM SUSY
0.5000 mL | PREFILLED_SYRINGE | Freq: Once | INTRAMUSCULAR | Status: AC
  Administered 2020-08-18: 0.5 mL via INTRAMUSCULAR
  Filled 2020-08-18: qty 0.5

## 2020-08-18 MED ORDER — SODIUM CHLORIDE 0.9 % IV SOLN
3.0000 g | Freq: Four times a day (QID) | INTRAVENOUS | Status: DC
  Administered 2020-08-18 – 2020-08-20 (×7): 3 g via INTRAVENOUS
  Filled 2020-08-18 (×8): qty 8

## 2020-08-18 MED ORDER — LACTATED RINGERS IV SOLN: INTRAVENOUS | Status: DC

## 2020-08-18 MED ORDER — CHLORHEXIDINE GLUCONATE 0.12 % MT SOLN
OROMUCOSAL | Status: AC
  Administered 2020-08-18: 15 mL via OROMUCOSAL
  Filled 2020-08-18: qty 15

## 2020-08-18 MED ORDER — PROMETHAZINE HCL 12.5 MG RE SUPP
12.5000 mg | Freq: Four times a day (QID) | RECTAL | Status: DC | PRN
  Filled 2020-08-18: qty 1

## 2020-08-18 MED ORDER — LIDOCAINE 2% (20 MG/ML) 5 ML SYRINGE
INTRAMUSCULAR | Status: DC | PRN
  Administered 2020-08-18: 80 mg via INTRAVENOUS

## 2020-08-18 MED ORDER — DEXAMETHASONE SODIUM PHOSPHATE 10 MG/ML IJ SOLN
INTRAMUSCULAR | Status: DC | PRN
  Administered 2020-08-18: 4 mg via INTRAVENOUS

## 2020-08-18 MED ORDER — DOCUSATE SODIUM 100 MG PO CAPS
100.0000 mg | ORAL_CAPSULE | Freq: Two times a day (BID) | ORAL | Status: DC
  Administered 2020-08-18 – 2020-08-19 (×3): 100 mg via ORAL
  Filled 2020-08-18 (×3): qty 1

## 2020-08-18 MED ORDER — SODIUM CHLORIDE 0.9 % IV SOLN
3.0000 g | INTRAVENOUS | Status: AC
  Administered 2020-08-18: 3 g via INTRAVENOUS
  Filled 2020-08-18: qty 8

## 2020-08-18 MED ORDER — ONDANSETRON HCL 4 MG/2ML IJ SOLN
INTRAMUSCULAR | Status: DC | PRN
  Administered 2020-08-18: 4 mg via INTRAVENOUS

## 2020-08-18 MED ORDER — HYDROMORPHONE HCL 1 MG/ML IJ SOLN
0.2500 mg | INTRAMUSCULAR | Status: DC | PRN
  Administered 2020-08-18 (×4): 0.5 mg via INTRAVENOUS

## 2020-08-18 MED ORDER — FAMOTIDINE 20 MG PO TABS: 20.0000 mg | ORAL_TABLET | Freq: Two times a day (BID) | ORAL | Status: DC | PRN

## 2020-08-18 MED ORDER — MIDAZOLAM HCL 5 MG/5ML IJ SOLN
INTRAMUSCULAR | Status: DC | PRN
  Administered 2020-08-18: 2 mg via INTRAVENOUS

## 2020-08-18 MED ORDER — ALPRAZOLAM 0.5 MG PO TABS
0.5000 mg | ORAL_TABLET | Freq: Four times a day (QID) | ORAL | Status: DC | PRN
Start: 2020-08-18 — End: 2020-08-20

## 2020-08-18 MED ORDER — OXYCODONE HCL 5 MG PO TABS
10.0000 mg | ORAL_TABLET | ORAL | Status: DC | PRN
  Administered 2020-08-19: 10 mg via ORAL
  Administered 2020-08-19 – 2020-08-20 (×3): 15 mg via ORAL
  Filled 2020-08-18 (×3): qty 3

## 2020-08-18 MED ORDER — FENTANYL CITRATE (PF) 100 MCG/2ML IJ SOLN
INTRAMUSCULAR | Status: DC | PRN
  Administered 2020-08-18 (×2): 100 ug via INTRAVENOUS
  Administered 2020-08-18: 50 ug via INTRAVENOUS

## 2020-08-18 MED ORDER — ONDANSETRON HCL 4 MG/2ML IJ SOLN
4.0000 mg | Freq: Once | INTRAMUSCULAR | Status: AC
  Administered 2020-08-18: 4 mg via INTRAVENOUS
  Filled 2020-08-18: qty 2

## 2020-08-18 MED ORDER — PROPOFOL 10 MG/ML IV BOLUS
INTRAVENOUS | Status: DC | PRN
  Administered 2020-08-18: 200 mg via INTRAVENOUS

## 2020-08-18 MED ORDER — OXYCODONE HCL 5 MG PO TABS: 5.0000 mg | ORAL_TABLET | Freq: Once | ORAL | Status: DC | PRN

## 2020-08-18 MED ORDER — OXYCODONE HCL 5 MG PO TABS
5.0000 mg | ORAL_TABLET | ORAL | Status: DC | PRN
  Administered 2020-08-19: 5 mg via ORAL
  Filled 2020-08-18: qty 1
  Filled 2020-08-18: qty 2

## 2020-08-18 MED ORDER — HYDROMORPHONE HCL 1 MG/ML IJ SOLN
0.5000 mg | INTRAMUSCULAR | Status: DC | PRN
  Administered 2020-08-18 – 2020-08-20 (×8): 1 mg via INTRAVENOUS
  Filled 2020-08-18 (×8): qty 1

## 2020-08-18 SURGICAL SUPPLY — 49 items
BNDG CMPR MD 5X2 ELC HKLP STRL (GAUZE/BANDAGES/DRESSINGS) ×2
BNDG COHESIVE 2X5 TAN STRL LF (GAUZE/BANDAGES/DRESSINGS) ×1 IMPLANT
BNDG CONFORM 2 STRL LF (GAUZE/BANDAGES/DRESSINGS) ×1 IMPLANT
BNDG ELASTIC 2 VLCR STRL LF (GAUZE/BANDAGES/DRESSINGS) ×1 IMPLANT
BNDG ELASTIC 3X5.8 VLCR STR LF (GAUZE/BANDAGES/DRESSINGS) ×1 IMPLANT
BNDG ELASTIC 4X5.8 VLCR STR LF (GAUZE/BANDAGES/DRESSINGS) ×3 IMPLANT
BNDG GAUZE ELAST 4 BULKY (GAUZE/BANDAGES/DRESSINGS) ×8 IMPLANT
CORD BIPOLAR FORCEPS 12FT (ELECTRODE) ×4 IMPLANT
COVER SURGICAL LIGHT HANDLE (MISCELLANEOUS) ×4 IMPLANT
COVER WAND RF STERILE (DRAPES) ×2 IMPLANT
CUFF TOURN SGL QUICK 18X4 (TOURNIQUET CUFF) ×3 IMPLANT
CUFF TOURN SGL QUICK 24 (TOURNIQUET CUFF)
CUFF TRNQT CYL 24X4X16.5-23 (TOURNIQUET CUFF) IMPLANT
DRAPE EXTREMITY T 121X128X90 (DISPOSABLE) ×2 IMPLANT
DRSG ADAPTIC 3X8 NADH LF (GAUZE/BANDAGES/DRESSINGS) ×3 IMPLANT
GAUZE PACKING IODOFORM 1/4X15 (PACKING) ×1 IMPLANT
GAUZE SPONGE 4X4 12PLY STRL (GAUZE/BANDAGES/DRESSINGS) ×4 IMPLANT
GAUZE XEROFORM 1X8 LF (GAUZE/BANDAGES/DRESSINGS) ×3 IMPLANT
GAUZE XEROFORM 5X9 LF (GAUZE/BANDAGES/DRESSINGS) ×2 IMPLANT
GLOVE BIOGEL M 8.0 STRL (GLOVE) ×6 IMPLANT
GLOVE SS BIOGEL STRL SZ 8 (GLOVE) ×2 IMPLANT
GLOVE SUPERSENSE BIOGEL SZ 8 (GLOVE) ×2
GOWN STRL REUS W/ TWL LRG LVL3 (GOWN DISPOSABLE) ×2 IMPLANT
GOWN STRL REUS W/ TWL XL LVL3 (GOWN DISPOSABLE) ×4 IMPLANT
GOWN STRL REUS W/TWL LRG LVL3 (GOWN DISPOSABLE) ×3
GOWN STRL REUS W/TWL XL LVL3 (GOWN DISPOSABLE) ×6
K-WIRE 0.9X102 (Wire) ×3 IMPLANT
KIT BASIN OR (CUSTOM PROCEDURE TRAY) ×3 IMPLANT
KIT TURNOVER KIT B (KITS) ×3 IMPLANT
KWIRE 0.9X102 (Wire) IMPLANT
MANIFOLD NEPTUNE II (INSTRUMENTS) ×5 IMPLANT
NDL HYPO 25GX1X1/2 BEV (NEEDLE) IMPLANT
NEEDLE HYPO 25GX1X1/2 BEV (NEEDLE) IMPLANT
NS IRRIG 1000ML POUR BTL (IV SOLUTION) ×3 IMPLANT
PACK ORTHO EXTREMITY (CUSTOM PROCEDURE TRAY) ×3 IMPLANT
PAD ARMBOARD 7.5X6 YLW CONV (MISCELLANEOUS) ×3 IMPLANT
PAD CAST 4YDX4 CTTN HI CHSV (CAST SUPPLIES) ×2 IMPLANT
PADDING CAST COTTON 4X4 STRL (CAST SUPPLIES) ×3
SET CYSTO W/LG BORE CLAMP LF (SET/KITS/TRAYS/PACK) ×3 IMPLANT
SOL PREP POV-IOD 4OZ 10% (MISCELLANEOUS) ×6 IMPLANT
SPONGE LAP 4X18 RFD (DISPOSABLE) ×3 IMPLANT
SUT CHROMIC 5 0 PS 3 (SUTURE) ×3 IMPLANT
SWAB CULTURE ESWAB REG 1ML (MISCELLANEOUS) IMPLANT
SYR CONTROL 10ML LL (SYRINGE) IMPLANT
TOWEL GREEN STERILE (TOWEL DISPOSABLE) ×3 IMPLANT
TOWEL GREEN STERILE FF (TOWEL DISPOSABLE) ×3 IMPLANT
TUBE CONNECTING 12X1/4 (SUCTIONS) ×3 IMPLANT
WATER STERILE IRR 1000ML POUR (IV SOLUTION) ×3 IMPLANT
YANKAUER SUCT BULB TIP NO VENT (SUCTIONS) ×3 IMPLANT

## 2020-08-18 NOTE — Anesthesia Procedure Notes (Signed)
Procedure Name: Intubation Date/Time: 08/18/2020 5:06 PM Performed by: Moshe Salisbury, CRNA Pre-anesthesia Checklist: Patient identified, Emergency Drugs available, Suction available and Patient being monitored Patient Re-evaluated:Patient Re-evaluated prior to induction Oxygen Delivery Method: Circle System Utilized Preoxygenation: Pre-oxygenation with 100% oxygen Induction Type: IV induction Ventilation: Mask ventilation without difficulty Laryngoscope Size: Mac and 4 Grade View: Grade I Tube type: Oral Tube size: 7.5 mm Number of attempts: 1 Airway Equipment and Method: Stylet Placement Confirmation: ETT inserted through vocal cords under direct vision,  positive ETCO2 and breath sounds checked- equal and bilateral Secured at: 21 cm Tube secured with: Tape Dental Injury: Teeth and Oropharynx as per pre-operative assessment

## 2020-08-18 NOTE — Anesthesia Preprocedure Evaluation (Addendum)
Anesthesia Evaluation  Patient identified by MRN, date of birth, ID band Patient awake    Reviewed: Allergy & Precautions, NPO status , Patient's Chart, lab work & pertinent test results  Airway Mallampati: II  TM Distance: >3 FB Neck ROM: Full    Dental no notable dental hx.    Pulmonary neg pulmonary ROS,    Pulmonary exam normal breath sounds clear to auscultation       Cardiovascular Exercise Tolerance: Good negative cardio ROS Normal cardiovascular exam Rhythm:Regular Rate:Normal     Neuro/Psych negative psych ROS   GI/Hepatic negative GI ROS, (+)     substance abuse  marijuana use,   Endo/Other  negative endocrine ROS  Renal/GU      Musculoskeletal negative musculoskeletal ROS (+)   Abdominal   Peds negative pediatric ROS (+)  Hematology   Anesthesia Other Findings Bilateral upper extremity injuries from pitbull attack (left worse than right)  Reproductive/Obstetrics negative OB ROS                            Anesthesia Physical Anesthesia Plan  ASA: II and emergent  Anesthesia Plan: General   Post-op Pain Management:    Induction: Intravenous  PONV Risk Score and Plan: 2 and Ondansetron, Dexamethasone, Treatment may vary due to age or medical condition and Midazolam  Airway Management Planned: Oral ETT  Additional Equipment: None  Intra-op Plan:   Post-operative Plan: Extubation in OR  Informed Consent: I have reviewed the patients History and Physical, chart, labs and discussed the procedure including the risks, benefits and alternatives for the proposed anesthesia with the patient or authorized representative who has indicated his/her understanding and acceptance.     Dental advisory given  Plan Discussed with: CRNA and Anesthesiologist  Anesthesia Plan Comments:        Anesthesia Quick Evaluation

## 2020-08-18 NOTE — Progress Notes (Signed)
Patient ID: Richard Guerra, male   DOB: Aug 16, 1990, 30 y.o.   MRN: [id number] Patient sustained a dog bite via his pitbull today.  This is a Engineer, water.  The patient and the pitbull are up-to-date on immunizations.  The patient has received the second tetanus shot today.  I was called to see the patient at 1:00 and gave him availability as immediate.  The patient has been waiting on surgical intervention since that time.  I would recommend irrigation debridement repair reconstruction and admission due to the high likelihood of infection.  The injury occurred at 9 AM.  I have reviewed his consultation/H&P note.  I have reviewed all issues plans and concerns.  We will move forward accordingly with surgical care algorithm.  Adasha Boehme MD

## 2020-08-18 NOTE — Anesthesia Postprocedure Evaluation (Signed)
Anesthesia Post Note  Patient: Richard Guerra  Procedure(s) Performed: IRRIGATION AND DEBRIDEMENT HAND AND FOREARM (Bilateral Hand) PERCUTANEOUS PINNING EXTREMITY (Left Hand)     Patient location during evaluation: PACU Anesthesia Type: General Level of consciousness: awake and alert Pain management: pain level controlled Vital Signs Assessment: post-procedure vital signs reviewed and stable Respiratory status: spontaneous breathing, nonlabored ventilation and respiratory function stable Cardiovascular status: blood pressure returned to baseline and stable Postop Assessment: no apparent nausea or vomiting Anesthetic complications: no   No complications documented.  Last Vitals:  Vitals:   08/18/20 1913 08/18/20 1928  BP: 128/88 120/76  Pulse: 90 82  Resp: 11 17  Temp: 36.6 C 36.6 C  SpO2: 95% 96%                  Beryle Lathe

## 2020-08-18 NOTE — Transfer of Care (Signed)
Immediate Anesthesia Transfer of Care Note  Patient: Richard Guerra  Procedure(s) Performed: IRRIGATION AND DEBRIDEMENT HAND AND FOREARM (Bilateral Hand) PERCUTANEOUS PINNING EXTREMITY (Left Hand)  Patient Location: PACU  Anesthesia Type:General  Level of Consciousness: drowsy and patient cooperative  Airway & Oxygen Therapy: Patient Spontanous Breathing and Patient connected to nasal cannula oxygen  Post-op Assessment: Report given to RN and Post -op Vital signs reviewed and stable  Post vital signs: Reviewed and stable  Last Vitals:  Vitals Value Taken Time  BP 140/91 08/18/20 1828  Temp 37 C 08/18/20 1828  Pulse 76 08/18/20 1839  Resp 11 08/18/20 1839  SpO2 100 % 08/18/20 1839  Vitals shown include unvalidated device data.  Last Pain:  Vitals:   08/18/20 1438  TempSrc:   PainSc: 4          Complications: No complications documented.

## 2020-08-18 NOTE — Consult Note (Signed)
Reason for Consult:Dog bites Referring Physician: Gardner Candle Time called: 1222 Time at bedside: 1253   Richard Guerra is an 30 y.o. male.  HPI: Richard Guerra was attacked by his pit bull earlier today. It bit both of his hands/forearms. He was brought to the ED where x-rays showed a left long finger distal phalanx fracture in addition to the multiple bites and lacerations and hand surgery was consulted. 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  Past Medical History:  Diagnosis Date  . Concussion    at age 25 due to fall while playing football  . Febrile seizure (HCC)    at age 62 months  . No pertinent past medical history     Past Surgical History:  Procedure Laterality Date  . INGUINAL HERNIA REPAIR  03/17/2012   Procedure: HERNIA REPAIR INGUINAL ADULT;  Surgeon: Wilmon Arms. Corliss Skains, MD;  Location: [facility] ORS;  Service: General;  Laterality: Right;  incarcerated,PARTIAL OMENTECTOMY  . INNER EAR SURGERY     tubes in both ears approx. 31 months old  . INSERTION OF MESH  03/17/2012   Procedure: INSERTION OF MESH;  Surgeon: Wilmon Arms. Corliss Skains, MD;  Location: [facility] ORS;  Service: General;;    No family history on file.  Social History:  reports that he has never smoked. He has never used smokeless tobacco. He reports current alcohol use. He reports current drug use. Drug: Marijuana.  Allergies:  Allergies  Allergen Reactions  . Azithromycin Nausea And Vomiting    Medications: I have reviewed the patient's current medications.  No results found for this or any previous visit (from the past 48 hour(s)).  DG Forearm Right  Result Date: 08/18/2020 CLINICAL DATA:  Bitten by pit bull EXAM: RIGHT FOREARM - 2 VIEW COMPARISON:  None. FINDINGS: There is no evidence of fracture or other focal bone lesions. No opaque foreign body. IMPRESSION: Negative. Electronically Signed   By: Marnee Spring M.D.   On: 08/18/2020 11:49   DG Hand Complete Left  Result Date: 08/18/2020 CLINICAL DATA:   Dog bite. EXAM: LEFT HAND - COMPLETE 3+ VIEW COMPARISON:  No prior. FINDINGS: Multiple fracture fragments noted the base of the distal phalanx of the left third digit. Fractures extend into the distal interphalangeal joint. Tiny bony density noted along the anterior aspect of the middle phalanx of the left second digit. This may represent a tiny fracture fragment. Small soft tissue injury noted. No radiopaque foreign body. IMPRESSION: 1. Multiple fracture fragments noted at the base of the distal phalanx of the left third digit. Fractures extend into the distal interphalangeal joint. 2. Tiny bony density noted along the anterior aspect of the middle phalanx left second digit. This may represent a tiny fracture fragment. 3.  Multiple soft tissue injuries.  No radiopaque foreign body. Electronically Signed   By: Maisie Fus  Register   On: 08/18/2020 12:04   DG Hand Complete Right  Result Date: 08/18/2020 CLINICAL DATA:  Bitten by Hipple EXAM: RIGHT HAND - COMPLETE 3+ VIEW COMPARISON:  06/18/2013 FINDINGS: Soft tissue gas to the ventral middle finger. No opaque foreign body, fracture, or dislocation. IMPRESSION: Soft tissue wound to the middle finger.  No fracture or dislocation. Electronically Signed   By: Marnee Spring M.D.   On: 08/18/2020 11:50    Review of Systems  HENT: Negative for ear discharge, ear pain, hearing loss and tinnitus.   Eyes: Negative for photophobia and pain.  Respiratory: Negative for cough and shortness of breath.  Cardiovascular: Negative for chest pain.  Gastrointestinal: Negative for abdominal pain, nausea and vomiting.  Genitourinary: Negative for dysuria, flank pain, frequency and urgency.  Musculoskeletal: Positive for arthralgias (Bilateral hands, L>R). Negative for back pain, myalgias and neck pain.  Neurological: Negative for dizziness and headaches.  Hematological: Does not bruise/bleed easily.  Psychiatric/Behavioral: The patient is not nervous/anxious.    Blood  pressure 133/89, pulse 81, temperature 98.1 F (36.7 C), temperature source Oral, resp. rate 15, height 5\' 5"  (1.651 m), weight 73.5 kg, SpO2 98 %. Physical Exam Constitutional:      General: He is not in acute distress.    Appearance: He is well-developed. He is not diaphoretic.  HENT:     Head: Normocephalic and atraumatic.  Eyes:     General: No scleral icterus.       Right eye: No discharge.        Left eye: No discharge.     Conjunctiva/sclera: Conjunctivae normal.  Cardiovascular:     Rate and Rhythm: Normal rate and regular rhythm.  Pulmonary:     Effort: Pulmonary effort is normal. No respiratory distress.  Musculoskeletal:     Cervical back: Normal range of motion.     Comments: Right shoulder, elbow, wrist, digits- Multiple puncture wounds proximal forearm and hand, no instability, no blocks to motion  Sens  Ax/R/M/U intact  Mot   Ax/ R/ PIN/ M/ AIN/ U intact  Rad 2+  Left shoulder, elbow, wrist, digits- Multiple puncture wounds hand with longitudinal laceration radial index finger and transverse laceration at radial DIP joint long finger , severe TTP, no instability, no blocks to motion  Sens  Ax/R/M/U intact  Mot   Ax/ R/ PIN/ M/ AIN/ U intact  Rad 2+  Skin:    General: Skin is warm and dry.  Neurological:     Mental Status: He is alert.  Psychiatric:        Behavior: Behavior normal.     Assessment/Plan: Dog bites -- Plan for I&D both hands today by Dr. . Please keep NPO. Anticipate discharge after surgery.    Amanda Pea, PA-C Orthopedic Surgery [phone] 08/18/2020, 1:18 PM

## 2020-08-18 NOTE — ED Triage Notes (Addendum)
Pt transported via [facility] with left hand injury along with right forearm injury due to dog bite. Pts has multiple puncture wounds and a fractured middle digit on left hand. Pt unsure of last tetanus shot. Pt stated it was his animal that bite him and that dogs shots are up to date.  160/100, 100HR

## 2020-08-18 NOTE — Op Note (Addendum)
Operative note Aug 18, 2020  Richard Severin MD.  Preoperative diagnosis status post pitbull mauling involving the right hand with right middle finger lacerations bite marks and puncture wounds as well as index finger bite wounds about the right hand and a right forearm dorsal wound.  Left middle finger open fracture with nail plate avulsion instability of the joint and an intra-articular DIP fracture with soft tissue injury.  Right index finger degloving injury with intact bony features.  Postop diagnosis: The same  Operative procedure #1 irrigation debridement right forearm skin subtenons tissue and muscle with exploration of the wound and irrigation excisional nature 1 x 3 cm with packing of the wound #2 right middle finger dorsal and volar irrigation and debridement with excisional debridement of the less than 1.5 cm deep wound involving subtenons tissue tendon and skin #3 extensor tendon tenolysis tendon synovectomy right middle finger #4 index and ring finger irrigation debridement skin and subcutaneous tissue excisional nature right hand #5 irrigation debridement open fracture left middle finger at the DIP joint #6 open duction internal fixation left middle finger distal interphalangeal joint secondary to interphalangeal joint fracture with instability #7 radial digital nerve neuroplasty left middle finger #8 loose closure wounds about the left middle finger #8 exploration with neuroplasty radial digital nerve left index finger #9 volar advancement flap left index finger #10 bilateral fluoroscopy and 5 view x-ray series performed examined and interpreted by myself.  Surgeon Richard Guerra  Anesthesia General  Tourniquet time 0  Estimated blood loss less than 20 cc  Description of procedure this patient had a pitbull mauling at 9 AM.  He presented to the ER.  I was called at 1 PM and was ready to move to the operative theater.  Unfortunately we were unable to get him to the operative theater  until after 4 PM due to OR backup's.  The patient was given Unasyn at 11 AM.  Once in the operative theater I asked for an additional 3 g of Unasyn and then performed prepped and draped with hydrogen peroxide remove blood followed by Hibiclens scrubs about the right and left upper extremities x2 Hibiclens scrubs followed by 10-minute surgical Betadine scrub and paint.  I performed the scrubs myself and was very thorough.  Sterile field was secured and the patient underwent a final timeout.  This time we performed irrigation debridement of the dorsal forearm.  The dorsal forearm had a 1 x 3 cm injury to it.  This was debrided.  This was an excisional debridement of skin subcutaneous tissue and muscle.  The patient tolerated this well.  Irrigation was applied.  Following this the middle finger was addressed with irrigation debridement of 2 wounds 1 volar 1 dorsal overlying the proximal phalanx.  Extensor tenolysis tendon synovectomy was accomplished without difficulty about the right middle finger.  I performed fluoroscopy and performed a 5 view x-ray series of the hand and forearm about the right side to verify no fractures.  Following this I placed a drain through and through the middle finger.  I should note 3 L of saline were used about the areas of the forearm and hands.  The index and ring finger had superficial injuries and these were cleaned up with curette knife and scissor.  This was excisional debridement of skin only.  This was less than a centimeter about the index and ring fingers.  Following this we then irrigated copiously and wounds were all left open and the packing was left in the  middle finger this will be removed later.  Once this was complete the patient then underwent sterile dressing application.  The arm was elevated and the patient then had attention turned towards the left side.  On the left side we performed irrigation debridement of the index finger with neuroplasty of the  radial digital nerve.  We then performed open excision of nonviable tissue about the middle finger this was an open fracture of the DIP joint with multiple lacerations.  Nonviable tissue was removed this included skin collateral ligament architecture and deep structures.  The bone was curettage.  Following this I placed 6 L through cystoscopy tubing through the fingers of the index and middle finger on the left side as I want to be very thorough.Debridement type: Excisional Debridement  Side: right  Body Location: hand   Tools used for debridement: scalpel, scissors and curette  Pre-debridement Wound size (cm):   Length: 1cm        Width: 84mm     Depth: 1cm   Post-debridement Wound size (cm):   Length: 1cm        Width: 1cm     Depth: 1cm   Debridement depth beyond dead/damaged tissue down to healthy viable tissue: yes  Tissue layer involved: skin, subcutaneous tissue, muscle / fascia  Nature of tissue removed: Devitalized Tissue  Irrigation volume: 1liter     Irrigation fluid type: Normal Saline   Debridement type: Excisional Debridement  Side: left  Body Location: finger   Tools used for debridement: scalpel, scissors and curette  Pre-debridement Wound size (cm):   Length: 1cm        Width: 1cm     Depth: 1cm   Post-debridement Wound size (cm):   Length: 1cm        Width: 1cm     Depth: 1cm   Debridement depth beyond dead/damaged tissue down to healthy viable tissue: yes  Tissue layer involved: skin, subcutaneous tissue, muscle / fascia, bone  Nature of tissue removed: Devitalized Tissue  Irrigation volume: 1liter      Irrigation fluid type: Normal Saline       We did everything we could to try to prevent infection.  The DIP joint was highly unstable.  This time I performed a volar advancement flap about the index finger with chromic suture as a protective measure in case this goes on to an infection.  I did not close the wound up tight but was able to get  coverage over vital structures.  Following this I placed a 0.035 Kirschner wire from distal to proximal and secured the DIP and articular fracture.  This K wire was bent and seated against the skin with a small stab incision and buried.  This will be removed at 4 to 6 weeks.  I did consider not pinning the joint due to the injury however the bony architecture was totally unstable given the collateral ligament and bony abnormality and injury.  Following this with the been performed additional irrigation followed by loose closure of the wounds.  The nail had already been avulsed and the nailbed was stable.  I did have to trim up some of the nailbed but there is no bony exposure.  I did all of these interventions with the tourniquet deflated and the patient did maintain refill.  He was dressed with Adaptic gauze 4 x 4's finger splints and gentle wrap.  We will plan for elevation IV Unasyn and close observation postoperatively.  He will be admitted due  to the severe nature of the injury.  I discussed with the patient all issues plans and concerns.  Should any problems occur patient will notify me.  It was a pleasure to see the patient today.  Benjy Kana MD

## 2020-08-18 NOTE — ED Provider Notes (Signed)
[facility] EMERGENCY DEPARTMENT Provider Note   CSN: [id number] Arrival date & time: 08/18/20  3267     History Chief Complaint  Patient presents with  . Animal Bite    Pt transported via [facility] with left hand injury along with right forearm injury due to dog bite. Pts has multiple puncture wounds and a fractured middle digit on left hand. Pt unsure of last tetanus shot. Pt stated it was his animal that bite him and that dogs shots are up to date.  160/100, 100HR     Richard Guerra is a 30 y.o. male.  HPI      Richard Guerra is a 30 y.o. male, patient without pertinent past medical history, presenting to the ED with dog bites to the hands that occurred shortly prior to arrival.  He particularly complains of throbbing, 10/10 pain to the left hand and middle finger.  He also endorses wounds to the right fingers, right hand, and right forearm. Dog is up-to-date on rabies vaccination.  Patient is unsure of last tetanus vaccination Denies anticoagulation. Denies neck wound, chest wounds, abdominal wounds, back wound, other extremity wounds, or any other complaints.   Past Medical History:  Diagnosis Date  . Concussion    at age 7 due to fall while playing football  . Febrile seizure (HCC)    at age 41 months  . No pertinent past medical history     Patient Active Problem List   Diagnosis Date Noted  . Blunt flank trauma 09/26/2011  . Liver laceration/contusion, grade I 09/26/2011  . Acute blood loss anemia 09/26/2011  . Renal hemorrhage, right-traumatic 09/21/2011    Past Surgical History:  Procedure Laterality Date  . INGUINAL HERNIA REPAIR  03/17/2012   Procedure: HERNIA REPAIR INGUINAL ADULT;  Surgeon: Wilmon Arms. Corliss Skains, MD;  Location: [facility]S;  Service: General;  Laterality: Right;  incarcerated,PARTIAL OMENTECTOMY  . INNER EAR SURGERY     tubes in both ears approx. 34 months old  . INSERTION OF MESH  03/17/2012   Procedure:  INSERTION OF MESH;  Surgeon: Wilmon Arms. Corliss Skains, MD;  Location: [facility];  Service: General;;       History reviewed. No pertinent family history.  Social History   Tobacco Use  . Smoking status: Never Smoker  . Smokeless tobacco: Never Used  Substance Use Topics  . Alcohol use: Yes    Comment: social  . Drug use: Yes    Types: Marijuana    Comment: smokes marijuana "sometimes"    Home Medications Prior to Admission medications   Medication Sig Start Date End Date Taking? Authorizing Provider  ondansetron (ZOFRAN ODT) 4 MG disintegrating tablet 4mg  ODT q4 hours prn nausea/vomit Patient not taking: Reported on 08/18/2020 05/06/16   Pisciotta, 05/08/16, PA-C  promethazine (PHENERGAN) 25 MG tablet Take 1 tablet (25 mg total) by mouth every 6 (six) hours as needed for nausea or vomiting. Patient not taking: Reported on 08/18/2020 05/06/16   Pisciotta, 05/08/16, PA-C    Allergies    Azithromycin  Review of Systems   Review of Systems  Respiratory: Negative for shortness of breath.   Cardiovascular: Negative for chest pain.  Gastrointestinal: Negative for abdominal pain, nausea and vomiting.  Musculoskeletal: Positive for arthralgias. Negative for back pain and neck pain.  Skin: Positive for wound.  Neurological: Negative for weakness and numbness.  All other systems reviewed and are negative.   Physical Exam Updated Vital Signs BP 127/84 (BP Location: Right  Arm)   Pulse 86   Temp 98.1 F (36.7 C) (Oral)   Resp 18   Ht 5\' 5"  (1.651 m)   Wt 73.5 kg   SpO2 98%   BMI 26.96 kg/m   Physical Exam Vitals and nursing note reviewed.  Constitutional:      General: He is not in acute distress.    Appearance: He is well-developed. He is not diaphoretic.  HENT:     Head: Normocephalic and atraumatic.     Comments: Face and head were examined without evidence of injury.    Mouth/Throat:     Mouth: Mucous membranes are moist.     Pharynx: Oropharynx is clear.  Eyes:      Conjunctiva/sclera: Conjunctivae normal.  Cardiovascular:     Rate and Rhythm: Normal rate and regular rhythm.     Pulses: Normal pulses.          Radial pulses are 2+ on the right side and 2+ on the left side.       Posterior tibial pulses are 2+ on the right side and 2+ on the left side.     Heart sounds: Normal heart sounds.     Comments: Tactile temperature in the extremities appropriate and equal bilaterally. Pulmonary:     Effort: Pulmonary effort is normal. No respiratory distress.     Breath sounds: Normal breath sounds.  Abdominal:     Palpations: Abdomen is soft.     Tenderness: There is no abdominal tenderness. There is no guarding.  Musculoskeletal:     Cervical back: Normal range of motion and neck supple. No tenderness.     Comments: Multiple puncture wounds to the bilateral hands and fingers.  Puncture wounds and bruising to the right forearm, as shown in the photos. Deformity to the distal left middle finger at the DIP. No pain, tenderness, deformity, instability, pain with range of motion to the wrists, elbows, shoulders bilaterally.  Overall trauma exam performed without any abnormalities noted other than those mentioned.  Skin:    General: Skin is warm and dry.     Capillary Refill: Capillary refill takes less than 2 seconds.  Neurological:     Mental Status: He is alert.     Comments: Sensation to light touch grossly intact in each of the fingers bilaterally. Motor function intact in each of the fingers of the bilateral hands. Strength and motor function intact in the joints of the upper and lower extremities bilaterally.  Psychiatric:        Mood and Affect: Mood and affect normal.        Speech: Speech normal.        Behavior: Behavior normal.                   ED Results / Procedures / Treatments   Labs (all labs ordered are listed, but only abnormal results are displayed) Labs Reviewed  RESP PANEL BY RT-PCR (FLU A&B, COVID) [id number]     EKG None  Radiology DG Forearm Right  Result Date: 08/18/2020 CLINICAL DATA:  Bitten by pit bull EXAM: RIGHT FOREARM - 2 VIEW COMPARISON:  None. FINDINGS: There is no evidence of fracture or other focal bone lesions. No opaque foreign body. IMPRESSION: Negative. Electronically Signed   By: 10/18/2020 M.D.   On: 08/18/2020 11:49   DG Hand Complete Left  Result Date: 08/18/2020 CLINICAL DATA:  Dog bite. EXAM: LEFT HAND - COMPLETE 3+ VIEW COMPARISON:  No prior. FINDINGS: Multiple  fracture fragments noted the base of the distal phalanx of the left third digit. Fractures extend into the distal interphalangeal joint. Tiny bony density noted along the anterior aspect of the middle phalanx of the left second digit. This may represent a tiny fracture fragment. Small soft tissue injury noted. No radiopaque foreign body. IMPRESSION: 1. Multiple fracture fragments noted at the base of the distal phalanx of the left third digit. Fractures extend into the distal interphalangeal joint. 2. Tiny bony density noted along the anterior aspect of the middle phalanx left second digit. This may represent a tiny fracture fragment. 3.  Multiple soft tissue injuries.  No radiopaque foreign body. Electronically Signed   By: Maisie Fus  Register   On: 08/18/2020 12:04   DG Hand Complete Right  Result Date: 08/18/2020 CLINICAL DATA:  Bitten by Hipple EXAM: RIGHT HAND - COMPLETE 3+ VIEW COMPARISON:  06/18/2013 FINDINGS: Soft tissue gas to the ventral middle finger. No opaque foreign body, fracture, or dislocation. IMPRESSION: Soft tissue wound to the middle finger.  No fracture or dislocation. Electronically Signed   By: Marnee Spring M.D.   On: 08/18/2020 11:50    Procedures Procedures   Medications Ordered in ED Medications  lactated ringers infusion (has no administration in time range)  chlorhexidine (PERIDEX) 0.12 % solution 15 mL (has no administration in time range)    Or  MEDLINE mouth rinse (has no  administration in time range)  ondansetron (ZOFRAN) injection 4 mg (4 mg Intravenous Given 08/18/20 1053)  morphine 4 MG/ML injection 4 mg (4 mg Intravenous Given 08/18/20 1055)  Tdap (BOOSTRIX) injection 0.5 mL (0.5 mLs Intramuscular Given 08/18/20 1057)  lactated ringers bolus 1,000 mL (0 mLs Intravenous Stopped 08/18/20 1210)  Ampicillin-Sulbactam (UNASYN) 3 g in sodium chloride 0.9 % 100 mL IVPB (0 g Intravenous Stopped 08/18/20 1237)  morphine 4 MG/ML injection 4 mg (4 mg Intravenous Given 08/18/20 1218)  morphine 4 MG/ML injection 4 mg (4 mg Intravenous Given 08/18/20 1414)    ED Course  I have reviewed the triage vital signs and the nursing notes.  Pertinent labs & imaging results that were available during my care of the patient were reviewed by me and considered in my medical decision making (see chart for details).  Clinical Course as of 08/18/20 1620  Tue Aug 18, 2020  [id number] 30 year old male here with multiple lacerations and puncture wounds after his dog bit him numerous times on his arms bilateral.  He is got a deformed left middle finger.  Getting IV antibiotics pain medication x-rays and likely hand consult. [MB]  1221 Spoke with Charma Igo, PA on-call for hand surgery. States he will go assess the patient. [SJ]  1304 Charma Igo evaluated the patient.  States they will take the patient to the OR.  Requests COVID testing. [SJ]    Clinical Course User Index [MB] Terrilee Files, MD [SJ] Concepcion Living   MDM Rules/Calculators/A&P                          Patient presents with dog bites to his hands and right forearm. I did not note signs of neurovascular compromise. Antibiotics and pain management initiated. Patient taken to the OR for cleaning and management of his wounds.   Final Clinical Impression(s) / ED Diagnoses Final diagnoses:  Dog bite, initial encounter    Rx / DC Orders ED Discharge Orders    None  Anselm PancoastJoy, Worth Kober C, PA-C 08/18/20 1620     Terrilee FilesButler, Michael C, MD 08/18/20 1745

## 2020-08-18 NOTE — Progress Notes (Signed)
Compression/ace wraps in place to bilateral arms right hand first and middle finger covered able to move other fingers on right hand left hand wrap noted to able to move all fingers

## 2020-08-19 ENCOUNTER — Encounter (HOSPITAL_COMMUNITY): Payer: Self-pay | Admitting: Orthopedic Surgery

## 2020-08-19 NOTE — Plan of Care (Signed)
  Problem: Activity: Goal: Risk for activity intolerance will decrease Outcome: Progressing   Problem: Pain Managment: Goal: General experience of comfort will improve Outcome: Progressing   Problem: Safety: Goal: Ability to remain free from injury will improve Outcome: Progressing   

## 2020-08-19 NOTE — Progress Notes (Signed)
Patient ID: Richard Guerra, male   DOB: 09-Jun-1990, 30 y.o.   MRN: [id number] Stable exam.  Patient is doing quite well.  No complications or signs of infection.  I performed a comprehensive dressing change on the right hand this actually looks very well.  We will leave the left hand alone until first postop visit next week.  I discussed all issues with he and his family.  We will plan for discharge tomorrow on Augmentin but I would like to keep him on Unasyn IV overnight. Chest is clear abdomen is nontender he is tolerating a diet no signs of DVT or problems.  He is doing quite well overall given the severity of his injury.  I will plan for discharge tomorrow.  Evie Croston MD

## 2020-08-19 NOTE — Plan of Care (Signed)
Care Plan Added 

## 2020-08-20 MED ORDER — ONDANSETRON HCL 4 MG PO TABS: 4.0000 mg | ORAL_TABLET | Freq: Every day | ORAL | 1 refills | Status: AC | PRN

## 2020-08-20 MED ORDER — AMOXICILLIN-POT CLAVULANATE 875-125 MG PO TABS: 1.0000 | ORAL_TABLET | Freq: Two times a day (BID) | ORAL | 0 refills | Status: AC

## 2020-08-20 MED ORDER — METHOCARBAMOL 500 MG PO TABS: 500.0000 mg | ORAL_TABLET | Freq: Four times a day (QID) | ORAL | 0 refills | Status: DC

## 2020-08-20 MED ORDER — OXYCODONE HCL 5 MG PO TABS: 5.0000 mg | ORAL_TABLET | ORAL | 0 refills | Status: AC | PRN

## 2020-08-20 NOTE — Discharge Instructions (Signed)
Please change your bandage on the right hand daily as instructed by Dr. Amanda Pea.  Please take your medicines until they are completed.  You will take Augmentin twice a day.  We encourage you to use a cup of yogurt and a probiotic to protect your intestinal flora  Please call for any problems.  Please elevate and keep the bandages clean and dry.  Please do not remove your left bandage.

## 2020-08-20 NOTE — Discharge Summary (Signed)
Physician Discharge Summary  Patient ID: GLENDON DUNWOODY MRN: [id number] DOB/AGE: 1990-06-23 30 y.o.  Admit date: 08/18/2020 Discharge date:   Admission Diagnoses: ANIMAL BITE Past Medical History:  Diagnosis Date  . Concussion    at age 28 due to fall while playing football  . Febrile seizure (HCC)    at age 84 months  . No pertinent past medical history     Discharge Diagnoses:  Active Problems:   Dog bite   Surgeries: Procedure(s): IRRIGATION AND DEBRIDEMENT HAND AND FOREARM PERCUTANEOUS PINNING EXTREMITY on 08/18/2020    Consultants: Treatment Team:  Dominica Severin, MD  Discharged Condition: Improved  Hospital Course: COURVOISIER HAMBLEN is an 30 y.o. male who was admitted 08/18/2020 with a chief complaint of  Chief Complaint  Patient presents with  . Animal Bite    Pt transported via [facility] with left hand injury along with right forearm injury due to dog bite. Pts has multiple puncture wounds and a fractured middle digit on left hand. Pt unsure of last tetanus shot. Pt stated it was his animal that bite him and that dogs shots are up to date.  160/100, 100HR   , and found to have a diagnosis of ANIMAL BITE.  They were brought to the operating room on 08/18/2020 and underwent Procedure(s): IRRIGATION AND DEBRIDEMENT HAND AND FOREARM PERCUTANEOUS PINNING EXTREMITY.    They were given perioperative antibiotics:  Anti-infectives (From admission, onward)   Start     Dose/Rate Route Frequency Ordered Stop   08/20/20 0000  amoxicillin-clavulanate (AUGMENTIN) 875-125 MG tablet        1 tablet Oral 2 times daily 08/20/20 0725 09/03/20 2359   08/18/20 2330  Ampicillin-Sulbactam (UNASYN) 3 g in sodium chloride 0.9 % 100 mL IVPB        3 g 200 mL/hr over 30 Minutes Intravenous Every 6 hours 08/18/20 2027     08/18/20 1715  Ampicillin-Sulbactam (UNASYN) 3 g in sodium chloride 0.9 % 100 mL IVPB        3 g 200 mL/hr over 30 Minutes Intravenous To Surgery 08/18/20 1709  08/18/20 1730   08/18/20 1045  Ampicillin-Sulbactam (UNASYN) 3 g in sodium chloride 0.9 % 100 mL IVPB        3 g 200 mL/hr over 30 Minutes Intravenous  Once 08/18/20 1042 08/18/20 1237    .  They were given sequential compression devices, early ambulation, and   Recent vital signs:  Patient Vitals for the past 24 hrs:  BP Temp Temp src Pulse Resp SpO2  08/20/20 0300 (!) 141/98 98 F (36.7 C) Oral 68 18 96 %  08/19/20 2148 118/65 98.1 F (36.7 C) Oral 71 18 97 %  08/19/20 1543 110/69 98.1 F (36.7 C) Oral 68 16 97 %  .  Recent laboratory studies: DG Forearm Right  Result Date: 08/18/2020 CLINICAL DATA:  Bitten by pit bull EXAM: RIGHT FOREARM - 2 VIEW COMPARISON:  None. FINDINGS: There is no evidence of fracture or other focal bone lesions. No opaque foreign body. IMPRESSION: Negative. Electronically Signed   By: Marnee Spring M.D.   On: 08/18/2020 11:49   DG Hand Complete Left  Result Date: 08/18/2020 CLINICAL DATA:  Dog bite. EXAM: LEFT HAND - COMPLETE 3+ VIEW COMPARISON:  No prior. FINDINGS: Multiple fracture fragments noted the base of the distal phalanx of the left third digit. Fractures extend into the distal interphalangeal joint. Tiny bony density noted along the anterior aspect of the middle phalanx of  the left second digit. This may represent a tiny fracture fragment. Small soft tissue injury noted. No radiopaque foreign body. IMPRESSION: 1. Multiple fracture fragments noted at the base of the distal phalanx of the left third digit. Fractures extend into the distal interphalangeal joint. 2. Tiny bony density noted along the anterior aspect of the middle phalanx left second digit. This may represent a tiny fracture fragment. 3.  Multiple soft tissue injuries.  No radiopaque foreign body. Electronically Signed   By: Maisie Fus  Register   On: 08/18/2020 12:04   DG Hand Complete Right  Result Date: 08/18/2020 CLINICAL DATA:  Bitten by Hipple EXAM: RIGHT HAND - COMPLETE 3+ VIEW  COMPARISON:  06/18/2013 FINDINGS: Soft tissue gas to the ventral middle finger. No opaque foreign body, fracture, or dislocation. IMPRESSION: Soft tissue wound to the middle finger.  No fracture or dislocation. Electronically Signed   By: Marnee Spring M.D.   On: 08/18/2020 11:50    Discharge Medications:   Allergies as of 08/20/2020      Reactions   Azithromycin Nausea And Vomiting      Medication List    STOP taking these medications   ondansetron 4 MG disintegrating tablet Commonly known as: Zofran ODT   promethazine 25 MG tablet Commonly known as: PHENERGAN     TAKE these medications   amoxicillin-clavulanate 875-125 MG tablet Commonly known as: Augmentin Take 1 tablet by mouth 2 (two) times daily for 14 days.   methocarbamol 500 MG tablet Commonly known as: Robaxin Take 1 tablet (500 mg total) by mouth 4 (four) times daily.   ondansetron 4 MG tablet Commonly known as: Zofran Take 1 tablet (4 mg total) by mouth daily as needed for nausea or vomiting.   oxyCODONE 5 MG immediate release tablet Commonly known as: Roxicodone Take 1 tablet (5 mg total) by mouth every 4 (four) hours as needed.       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 FINDINGS: There is no evidence of fracture or other focal bone lesions. No opaque foreign body. IMPRESSION: Negative. Electronically Signed   By: Marnee Spring M.D.   On: 08/18/2020 11:49   DG Hand Complete Left  Result Date: 08/18/2020 CLINICAL DATA:  Dog bite. EXAM: LEFT HAND - COMPLETE 3+ VIEW COMPARISON:  No prior. FINDINGS: Multiple fracture fragments noted the base of the distal phalanx of the left third digit. Fractures extend into the distal interphalangeal joint. Tiny bony density noted along the anterior aspect of the middle phalanx of the left second digit. This may represent a tiny fracture fragment. Small soft tissue injury  noted. No radiopaque foreign body. IMPRESSION: 1. Multiple fracture fragments noted at the base of the distal phalanx of the left third digit. Fractures extend into the distal interphalangeal joint. 2. Tiny bony density noted along the anterior aspect of the middle phalanx left second digit. This may represent a tiny fracture fragment. 3.  Multiple soft tissue injuries.  No radiopaque foreign body. Electronically Signed   By: Maisie Fus  Register   On: 08/18/2020 12:04   DG Hand Complete Right  Result Date: 08/18/2020 CLINICAL DATA:  Bitten by Hipple EXAM: RIGHT HAND - COMPLETE 3+ VIEW COMPARISON:  06/18/2013 FINDINGS: Soft tissue gas to the ventral middle finger. No opaque foreign body, fracture, or dislocation. IMPRESSION: Soft tissue wound to the middle finger.  No fracture or dislocation. Electronically Signed  By: Marnee Spring M.D.   On: 08/18/2020 11:50    They benefited maximally from their hospital stay and there were no complications.     Disposition: Discharge disposition: 01-Home or Self Care      Discharge Instructions    Call MD / Call 911   Complete by: As directed    If you experience chest pain or shortness of breath, CALL 911 and be transported to the hospital emergency room.  If you develope a fever above 101 F, pus (white drainage) or increased drainage or redness at the wound, or calf pain, call your surgeon's office.   Constipation Prevention   Complete by: As directed    Drink plenty of fluids.  Prune juice may be helpful.  You may use a stool softener, such as Colace (over the counter) 100 mg twice a day.  Use MiraLax (over the counter) for constipation as needed.   Diet - low sodium heart healthy   Complete by: As directed    Increase activity slowly as tolerated   Complete by: As directed    Post-operative opioid taper instructions:   Complete by: As directed    POST-OPERATIVE OPIOID TAPER INSTRUCTIONS: It is important to wean off of your opioid medication as  soon as possible. If you do not need pain medication after your surgery it is ok to stop day one. Opioids include: Codeine, Hydrocodone(Norco, Vicodin), Oxycodone(Percocet, oxycontin) and hydromorphone amongst others.  Long term and even short term use of opiods can cause: Increased pain response Dependence Constipation Depression Respiratory depression And more.  Withdrawal symptoms can include Flu like symptoms Nausea, vomiting And more Techniques to manage these symptoms Hydrate well Eat regular healthy meals Stay active Use relaxation techniques(deep breathing, meditating, yoga) Do Not substitute Alcohol to help with tapering If you have been on opioids for less than two weeks and do not have pain than it is ok to stop all together.  Plan to wean off of opioids This plan should start within one week post op of your joint replacement. Maintain the same interval or time between taking each dose and first decrease the dose.  Cut the total daily intake of opioids by one tablet each day Next start to increase the time between doses. The last dose that should be eliminated is the evening dose.         Follow-up Information    Dominica Severin, MD Follow up in 5 day(s).   Specialty: Orthopedic Surgery Why: Please come to the office of Dr. Dominica Severin Monday morning at 8 AM for your follow-up check Contact information: [address] [place] [address] [phone]              Status post irrigation debridement bilateral pitbull bites.  Patient had multiple injuries.  He is stable.  We will discharge him on Augmentin for 14 days RTC in office for continued wound care and follow-up Monday at 8 AM.  At the time of discharge she has no signs of infection dystrophy or vascular compromise.  He is doing quite well.  I discussed with him all plans concerns and issues.  No complications during this hospital visit. Signed: Dionne Ano Mareli Antunes III 08/20/2020, 7:26  AM

## 2020-08-24 DIAGNOSIS — S62633D Displaced fracture of distal phalanx of left middle finger, subsequent encounter for fracture with routine healing: Secondary | ICD-10-CM | POA: Diagnosis not present

## 2020-09-03 DIAGNOSIS — S62633D Displaced fracture of distal phalanx of left middle finger, subsequent encounter for fracture with routine healing: Secondary | ICD-10-CM | POA: Diagnosis not present

## 2020-09-03 DIAGNOSIS — M79642 Pain in left hand: Secondary | ICD-10-CM | POA: Diagnosis not present

## 2020-09-24 DIAGNOSIS — S62603D Fracture of unspecified phalanx of left middle finger, subsequent encounter for fracture with routine healing: Secondary | ICD-10-CM | POA: Diagnosis not present

## 2020-10-05 DIAGNOSIS — S62603D Fracture of unspecified phalanx of left middle finger, subsequent encounter for fracture with routine healing: Secondary | ICD-10-CM | POA: Diagnosis not present

## 2020-10-05 DIAGNOSIS — M79645 Pain in left finger(s): Secondary | ICD-10-CM | POA: Diagnosis not present

## 2020-12-10 DIAGNOSIS — S62603D Fracture of unspecified phalanx of left middle finger, subsequent encounter for fracture with routine healing: Secondary | ICD-10-CM | POA: Diagnosis not present

## 2021-08-04 DIAGNOSIS — H5213 Myopia, bilateral: Secondary | ICD-10-CM | POA: Diagnosis not present

## 2021-08-04 DIAGNOSIS — H1789 Other corneal scars and opacities: Secondary | ICD-10-CM | POA: Diagnosis not present

## 2021-08-17 IMAGING — CR DG HAND COMPLETE 3+V*L*
3 series · 3 of 3 positions shown · non-contrast
Comparison: No prior.

CLINICAL DATA: Dog bite.

EXAM:
LEFT HAND - COMPLETE 3+ VIEW

[hand pa]
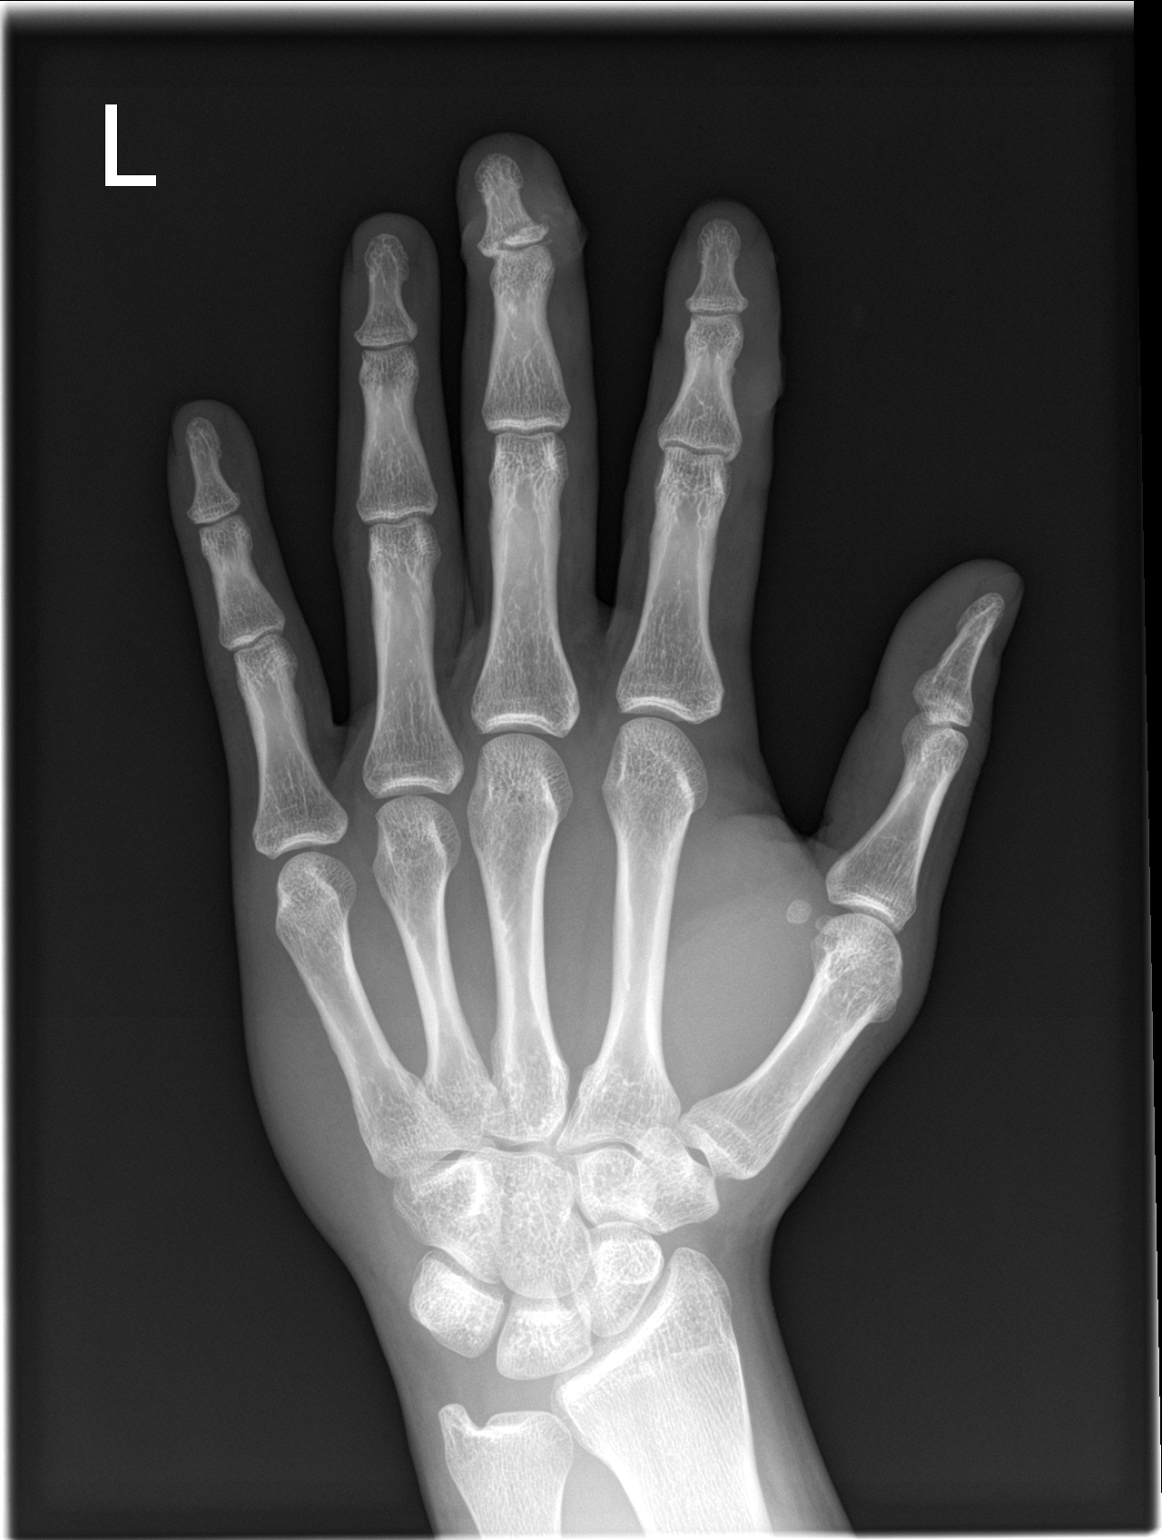

[hand obl]
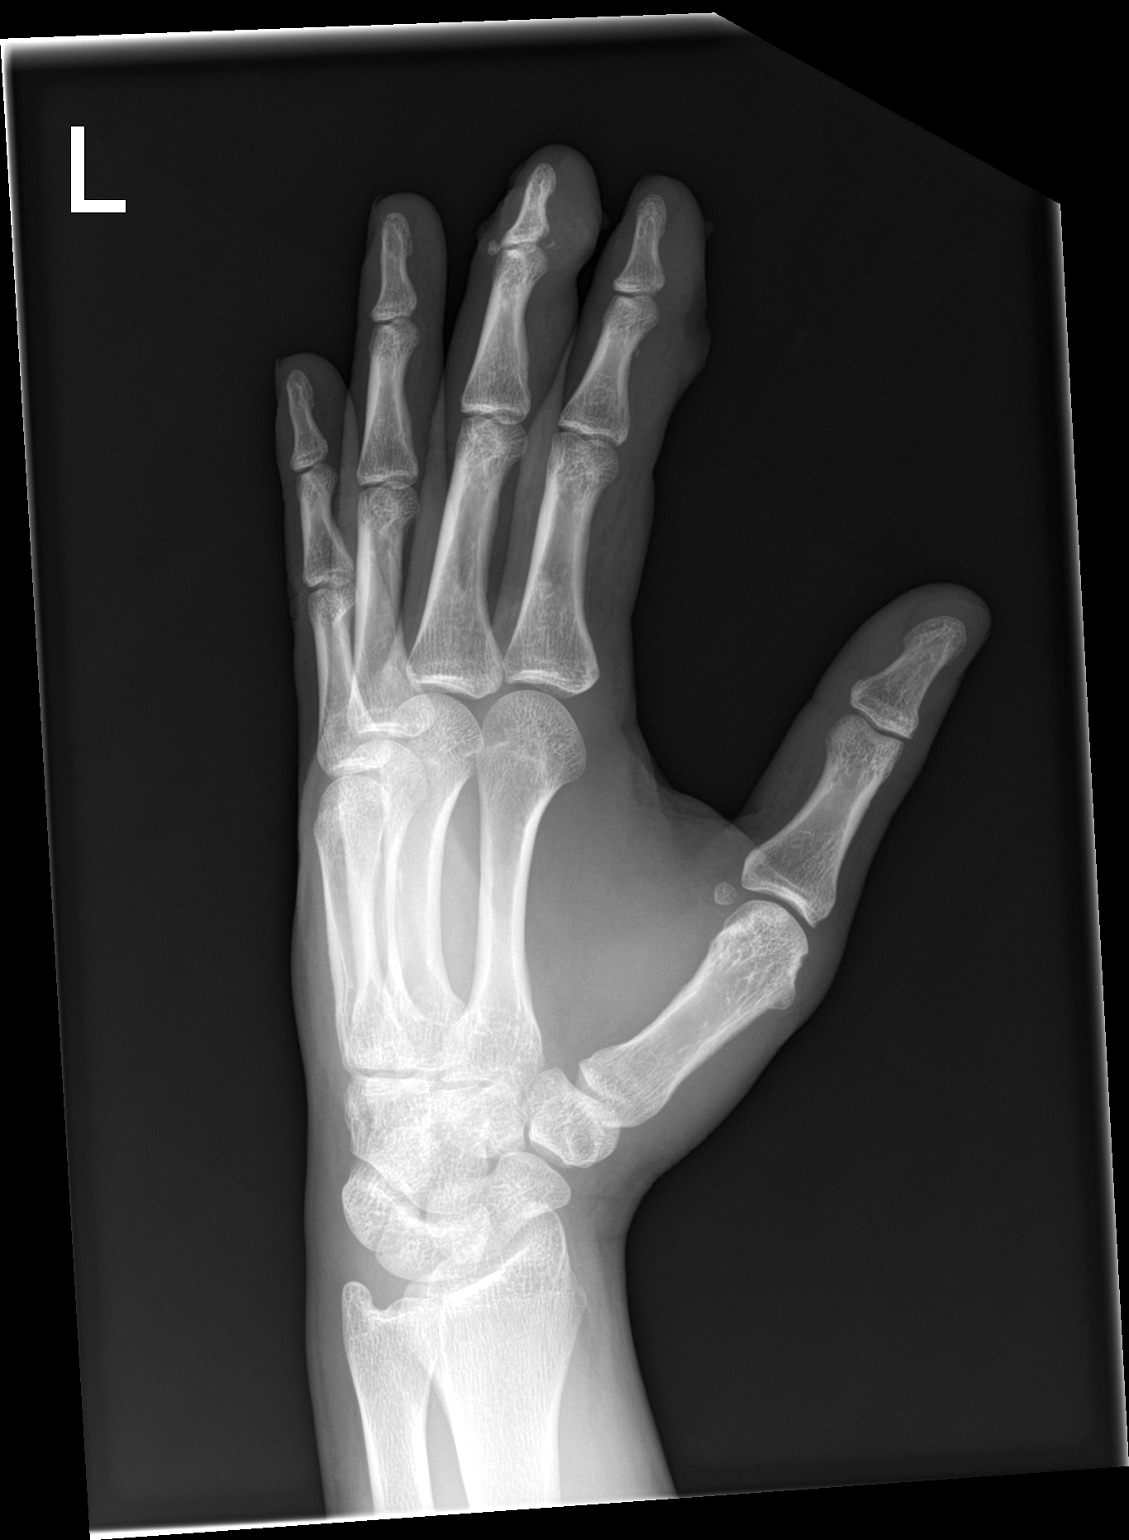

[hand lat]
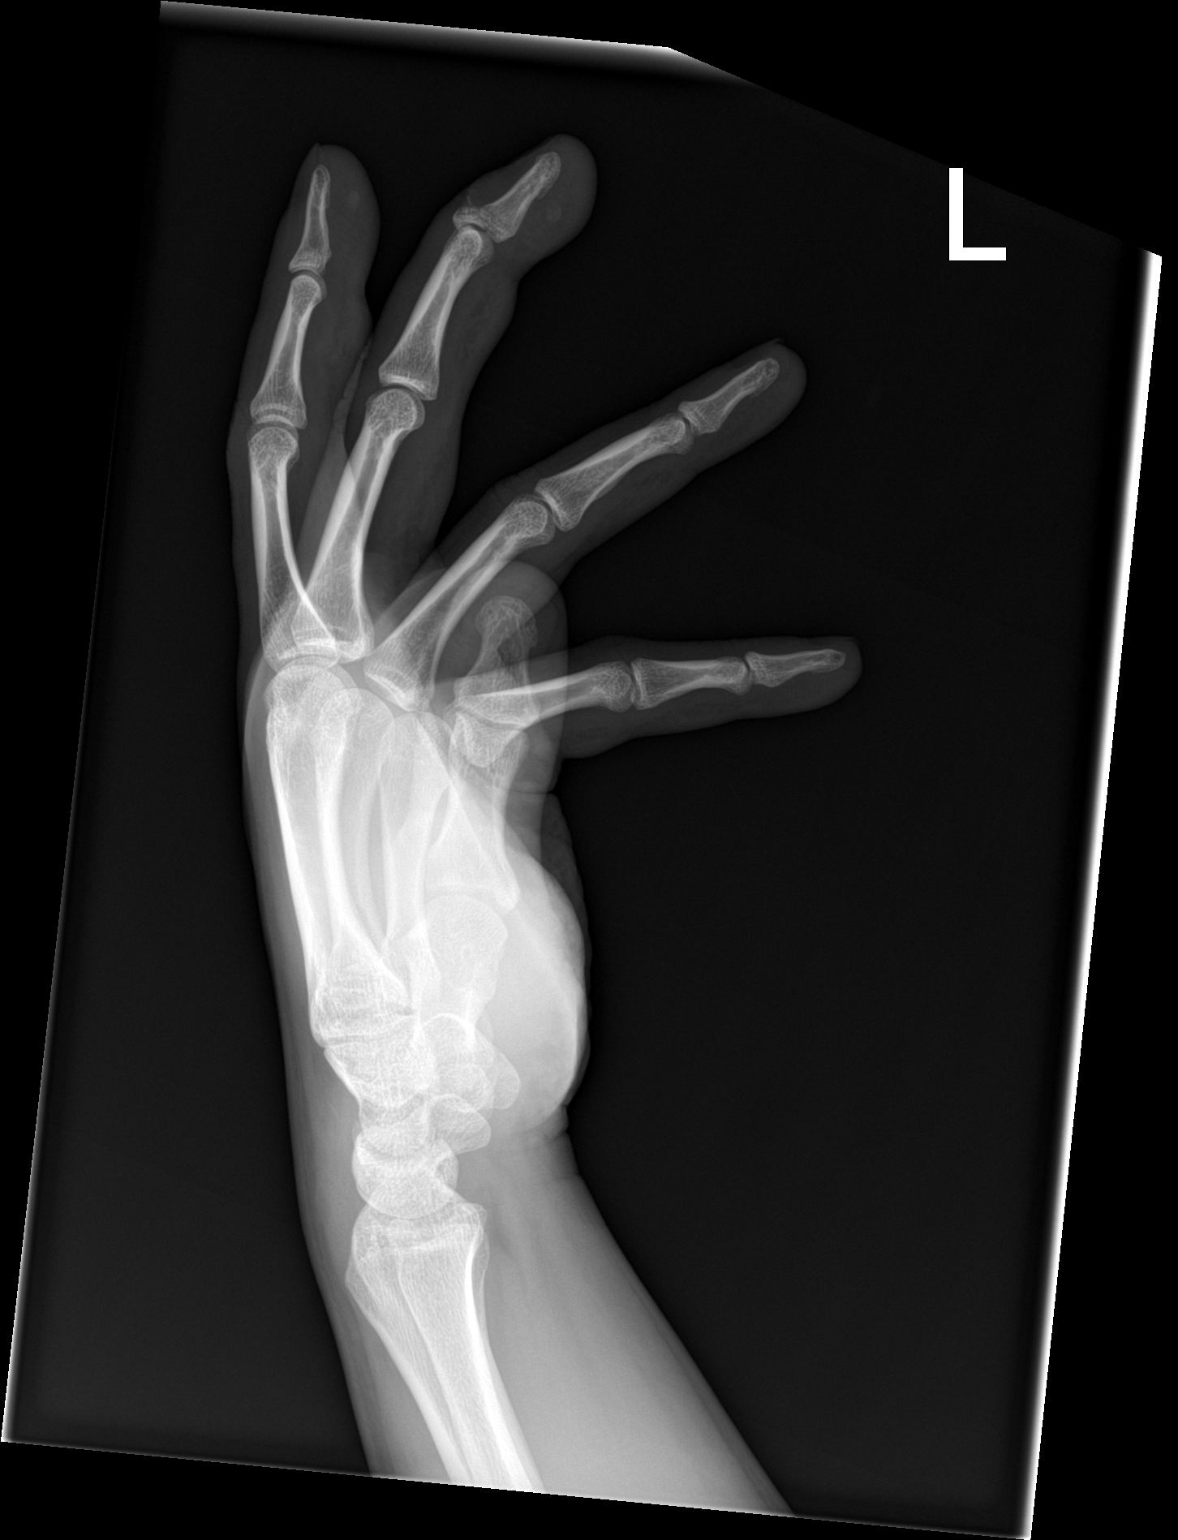

[3 of 3 positions shown; findings below may reference images not displayed]

FINDINGS: Multiple fracture fragments noted the base of the distal phalanx of
the left third digit. Fractures extend into the distal
interphalangeal joint. Tiny bony density noted along the anterior
aspect of the middle phalanx of the left second digit. This may
represent a tiny fracture fragment. Small soft tissue injury noted.
No radiopaque foreign body.
IMPRESSION: 1. Multiple fracture fragments noted at the base of the distal
phalanx of the left third digit. Fractures extend into the distal
interphalangeal joint.

2. Tiny bony density noted along the anterior aspect of the middle
phalanx left second digit. This may represent a tiny fracture
fragment.

3.  Multiple soft tissue injuries.  No radiopaque foreign body.

## 2021-11-04 DIAGNOSIS — R1114 Bilious vomiting: Secondary | ICD-10-CM | POA: Diagnosis not present

## 2021-11-05 ENCOUNTER — Encounter (HOSPITAL_BASED_OUTPATIENT_CLINIC_OR_DEPARTMENT_OTHER): Payer: Self-pay | Admitting: Emergency Medicine

## 2021-11-05 ENCOUNTER — Emergency Department (HOSPITAL_BASED_OUTPATIENT_CLINIC_OR_DEPARTMENT_OTHER)
Admission: EM | Admit: 2021-11-05 | Discharge: 2021-11-05 | Discharge status: Against Medical Advice | Payer: BC Managed Care – PPO | Attending: Emergency Medicine | Admitting: Emergency Medicine

## 2021-11-05 ENCOUNTER — Other Ambulatory Visit: Payer: Self-pay

## 2021-11-05 DIAGNOSIS — R112 Nausea with vomiting, unspecified: Secondary | ICD-10-CM | POA: Diagnosis not present

## 2021-11-05 DIAGNOSIS — Z5321 Procedure and treatment not carried out due to patient leaving prior to being seen by health care provider: Secondary | ICD-10-CM | POA: Diagnosis not present

## 2021-11-05 LAB — COMPREHENSIVE METABOLIC PANEL
ALT: 8 U/L (ref 0–44)
AST: 10 U/L — ABNORMAL LOW (ref 15–41)
Albumin: 5.1 g/dL — ABNORMAL HIGH (ref 3.5–5.0)
Alkaline Phosphatase: 57 U/L (ref 38–126)
Anion gap: 13 (ref 5–15)
BUN: 16 mg/dL (ref 6–20)
CO2: 25 mmol/L (ref 22–32)
Calcium: 10 mg/dL (ref 8.9–10.3)
Chloride: 98 mmol/L (ref 98–111)
Creatinine, Ser: 1.28 mg/dL — ABNORMAL HIGH (ref 0.61–1.24)
GFR, Estimated: >60 mL/min (ref >60)
Glucose, Bld: 102 mg/dL — ABNORMAL HIGH (ref 70–99)
Potassium: 3.2 mmol/L — ABNORMAL LOW (ref 3.5–5.1)
Sodium: 136 mmol/L (ref 135–145)
Total Bilirubin: 1.8 mg/dL — ABNORMAL HIGH (ref 0.3–1.2)
Total Protein: 7.5 g/dL (ref 6.5–8.1)

## 2021-11-05 LAB — CBC
HCT: 53 % — ABNORMAL HIGH (ref 39.0–52.0)
Hemoglobin: 17.8 g/dL — ABNORMAL HIGH (ref 13.0–17.0)
MCH: 23.1 pg — ABNORMAL LOW (ref 26.0–34.0)
MCHC: 33.6 g/dL (ref 30.0–36.0)
MCV: 68.7 fL — ABNORMAL LOW (ref 80.0–100.0)
Platelets: 247 10*3/uL (ref 150–400)
RBC: 7.71 MIL/uL — ABNORMAL HIGH (ref 4.22–5.81)
RDW: 16.7 % — ABNORMAL HIGH (ref 11.5–15.5)
WBC: 13.3 10*3/uL — ABNORMAL HIGH (ref 4.0–10.5)
nRBC: 0 % (ref 0.0–0.2)

## 2021-11-05 LAB — LIPASE, BLOOD: Lipase: 83 U/L — ABNORMAL HIGH (ref 11–51)

## 2021-11-05 NOTE — ED Triage Notes (Signed)
Started Wednesday nausea, vomiting. No diarrhea. (Per patient happens every 4-6 months) Was evaluated by PCP, per patient wbc high. Takes zofran and promethazine with some improvement. Some chest pain/tightness

## 2024-03-21 ENCOUNTER — Emergency Department (HOSPITAL_BASED_OUTPATIENT_CLINIC_OR_DEPARTMENT_OTHER)
Admission: EM | Admit: 2024-03-21 | Discharge: 2024-03-22 | Disposition: A | Attending: Emergency Medicine | Admitting: Emergency Medicine

## 2024-03-21 ENCOUNTER — Other Ambulatory Visit: Payer: Self-pay

## 2024-03-21 ENCOUNTER — Encounter (HOSPITAL_BASED_OUTPATIENT_CLINIC_OR_DEPARTMENT_OTHER): Payer: Self-pay | Admitting: Emergency Medicine

## 2024-03-21 DIAGNOSIS — R1116 Cannabis hyperemesis syndrome: Secondary | ICD-10-CM | POA: Insufficient documentation

## 2024-03-21 DIAGNOSIS — R1013 Epigastric pain: Secondary | ICD-10-CM | POA: Insufficient documentation

## 2024-03-21 NOTE — ED Notes (Signed)
  Offered IV zofran  but refused.

## 2024-03-21 NOTE — ED Triage Notes (Addendum)
  Patient comes in with epigastric pain and emesis that has been going on since Monday.  Patient states he has had minimal amounts of time that he has been able to tolerate food/fluids.  Endorses 4-5 episodes of bilious emesis today.  Pain 6/10, epigastric cramping.  Has been taking family members prednisone thinking it would help.  Endorses heavy marijuana usage over the past few days.    Patient states this happens to him every 6-8 months, but has not followed up with GI doctor.

## 2024-03-22 LAB — COMPREHENSIVE METABOLIC PANEL WITH GFR
ALT: 13 U/L (ref 0–44)
AST: 14 U/L — ABNORMAL LOW (ref 15–41)
Albumin: 5.1 g/dL — ABNORMAL HIGH (ref 3.5–5.0)
Alkaline Phosphatase: 75 U/L (ref 38–126)
Anion gap: 18 — ABNORMAL HIGH (ref 5–15)
BUN: 24 mg/dL — ABNORMAL HIGH (ref 6–20)
CO2: 22 mmol/L (ref 22–32)
Calcium: 9.9 mg/dL (ref 8.9–10.3)
Chloride: 97 mmol/L — ABNORMAL LOW (ref 98–111)
Creatinine, Ser: 1.41 mg/dL — ABNORMAL HIGH (ref 0.61–1.24)
GFR, Estimated: >60 mL/min (ref >60)
Glucose, Bld: 117 mg/dL — ABNORMAL HIGH (ref 70–99)
Potassium: 3.5 mmol/L (ref 3.5–5.1)
Sodium: 137 mmol/L (ref 135–145)
Total Bilirubin: 1.4 mg/dL — ABNORMAL HIGH (ref 0.0–1.2)
Total Protein: 7.7 g/dL (ref 6.5–8.1)

## 2024-03-22 LAB — CBC WITH DIFFERENTIAL/PLATELET
Abs Immature Granulocytes: 0.03 K/uL (ref 0.00–0.07)
Basophils Absolute: 0 K/uL (ref 0.0–0.1)
Basophils Relative: 0 %
Eosinophils Absolute: 0 K/uL (ref 0.0–0.5)
Eosinophils Relative: 0 %
HCT: 55.3 % — ABNORMAL HIGH (ref 39.0–52.0)
Hemoglobin: 18.5 g/dL — ABNORMAL HIGH (ref 13.0–17.0)
Immature Granulocytes: 0 %
Lymphocytes Relative: 10 %
Lymphs Abs: 1.3 K/uL (ref 0.7–4.0)
MCH: 22.6 pg — ABNORMAL LOW (ref 26.0–34.0)
MCHC: 33.5 g/dL (ref 30.0–36.0)
MCV: 67.5 fL — ABNORMAL LOW (ref 80.0–100.0)
Monocytes Absolute: 0.6 K/uL (ref 0.1–1.0)
Monocytes Relative: 5 %
Neutro Abs: 11.3 K/uL — ABNORMAL HIGH (ref 1.7–7.7)
Neutrophils Relative %: 85 %
Platelets: 301 K/uL (ref 150–400)
RBC: 8.19 MIL/uL — ABNORMAL HIGH (ref 4.22–5.81)
RDW: 17.2 % — ABNORMAL HIGH (ref 11.5–15.5)
Smear Review: NORMAL
WBC: 13.3 K/uL — ABNORMAL HIGH (ref 4.0–10.5)
nRBC: 0 % (ref 0.0–0.2)

## 2024-03-22 LAB — LIPASE, BLOOD: Lipase: 28 U/L (ref 11–51)

## 2024-03-22 MED ORDER — LACTATED RINGERS IV BOLUS
1000.0000 mL | Freq: Once | INTRAVENOUS | Status: AC
  Administered 2024-03-22: 1000 mL via INTRAVENOUS

## 2024-03-22 MED ORDER — PROCHLORPERAZINE MALEATE 10 MG PO TABS: 10.0000 mg | ORAL_TABLET | Freq: Two times a day (BID) | ORAL | 0 refills | Status: AC | PRN

## 2024-03-22 MED ORDER — DIPHENHYDRAMINE HCL 50 MG/ML IJ SOLN
25.0000 mg | Freq: Once | INTRAMUSCULAR | Status: AC
  Administered 2024-03-22: 25 mg via INTRAVENOUS
  Filled 2024-03-22: qty 1

## 2024-03-22 MED ORDER — DROPERIDOL 2.5 MG/ML IJ SOLN
1.2500 mg | Freq: Once | INTRAMUSCULAR | Status: AC
  Administered 2024-03-22: 1.25 mg via INTRAVENOUS
  Filled 2024-03-22: qty 2

## 2024-03-22 NOTE — ED Provider Notes (Signed)
 [place] EMERGENCY DEPARTMENT AT [facility]  Provider Note  CSN: [id number] Arrival date & time: 03/21/24 2313  History Chief Complaint  Patient presents with   Emesis   Abdominal Pain    Richard Guerra is a 33 y.o. male with no significant PMH reports 3-4 days of cramping, aching epigastric pain, associated with N/V. He has a history of same previously attributed to his frequency THC use which he continues. No diarrhea, no fever.    Home Medications Prior to Admission medications   Medication Sig Start Date End Date Taking? Authorizing Provider  prochlorperazine  (COMPAZINE ) 10 MG tablet Take 1 tablet (10 mg total) by mouth 2 (two) times daily as needed for nausea or vomiting. 03/22/24  Yes Roselyn Carlin NOVAK, MD     Allergies    Azithromycin   Review of Systems   Review of Systems Please see HPI for pertinent positives and negatives  Physical Exam BP 111/73   Pulse 73   Temp 98.9 F (37.2 C)   Resp 19   Ht 5' 6 (1.676 m)   Wt 73.5 kg   SpO2 95%   BMI 26.15 kg/m   Physical Exam Vitals and nursing note reviewed.  Constitutional:      Appearance: Normal appearance.  HENT:     Head: Normocephalic and atraumatic.     Nose: Nose normal.     Mouth/Throat:     Mouth: Mucous membranes are moist.  Eyes:     Extraocular Movements: Extraocular movements intact.     Conjunctiva/sclera: Conjunctivae normal.  Cardiovascular:     Rate and Rhythm: Normal rate.  Pulmonary:     Effort: Pulmonary effort is normal.     Breath sounds: Normal breath sounds.  Abdominal:     General: Abdomen is flat.     Palpations: Abdomen is soft.     Tenderness: There is abdominal tenderness in the epigastric area. There is no guarding. Negative signs include Murphy's sign and McBurney's sign.  Musculoskeletal:        General: No swelling. Normal range of motion.     Cervical back: Neck supple.  Skin:    General: Skin is warm and dry.  Neurological:     General: No  focal deficit present.     Mental Status: He is alert.  Psychiatric:        Mood and Affect: Mood normal.     ED Results / Procedures / Treatments   EKG EKG Interpretation Date/Time:  Friday March 22 2024 02:00:51 EST Ventricular Rate:  71 PR Interval:  140 QRS Duration:  98 QT Interval:  370 QTC Calculation: 402 R Axis:   75  Text Interpretation: Sinus rhythm ST elev, probable normal early repol pattern No significant change since last tracing Confirmed by Roselyn Carlin [phone]) on 03/22/2024 2:06:05 AM  Procedures Procedures  Medications Ordered in the ED Medications  lactated ringers  bolus 1,000 mL (0 mLs Intravenous Stopped 03/22/24 0320)  droperidol  (INAPSINE ) 2.5 MG/ML injection 1.25 mg (1.25 mg Intravenous Given 03/22/24 0217)  diphenhydrAMINE  (BENADRYL ) injection 25 mg (25 mg Intravenous Given 03/22/24 0240)    Initial Impression and Plan  Patient here with pain and vomiting similar to previous and consistent with suspected CHS. Labs done in triage show CBC with hemo concentrated WBC and Hgb, CMP with mildly elevated Cr, but similar to previous. Lipase is normal. Will give IVF and plan droperidol  if QT no prolonged on EKG.   ED Course   Clinical Course  as of 03/22/24 0336  Fri Mar 22, 2024  0335 Patient feeling better, tolerating PO and ready to go home. Recommend he stop all THC use. Advance diet as tolerated, Rx for compazine  if needed. PCP follow up, RTED for any other concerns.   [CS]    Clinical Course User Index [CS] Roselyn Carlin NOVAK, MD     MDM Rules/Calculators/A&P Medical Decision Making Given presenting complaint, I considered that admission might be necessary. After review of results from ED lab and/or imaging studies, admission to the hospital is not indicated at this time.    Problems Addressed: Cannabinoid hyperemesis syndrome: acute illness or injury  Amount and/or Complexity of Data Reviewed Labs: ordered. Decision-making details  documented in ED Course.  Risk Prescription drug management. Decision regarding hospitalization.     Final Clinical Impression(s) / ED Diagnoses Final diagnoses:  Cannabinoid hyperemesis syndrome    Rx / DC Orders ED Discharge Orders          Ordered    prochlorperazine  (COMPAZINE ) 10 MG tablet  2 times daily PRN        03/22/24 0336             Roselyn Carlin NOVAK, MD 03/22/24 [phone]
